# Patient Record
Sex: Female | Born: 1975 | Race: Black or African American | Hispanic: No | Marital: Married | Smoking: Never smoker
Health system: Southern US, Community
[De-identification: ages and names within clinical notes are randomized; demographics above are authoritative.]

## PROBLEM LIST (undated history)

## (undated) DIAGNOSIS — E669 Obesity, unspecified: Secondary | ICD-10-CM

## (undated) DIAGNOSIS — D649 Anemia, unspecified: Secondary | ICD-10-CM

## (undated) DIAGNOSIS — J45909 Unspecified asthma, uncomplicated: Secondary | ICD-10-CM

## (undated) HISTORY — PX: CHOLECYSTECTOMY: SHX55

---

## 2016-04-01 ENCOUNTER — Ambulatory Visit
Admission: EM | Admit: 2016-04-01 | Discharge: 2016-04-01 | Disposition: A | Discharge status: Home / Self Care | Payer: Self-pay | Attending: Family Medicine | Admitting: Family Medicine

## 2016-04-01 DIAGNOSIS — Z862 Personal history of diseases of the blood and blood-forming organs and certain disorders involving the immune mechanism: Secondary | ICD-10-CM

## 2016-04-01 DIAGNOSIS — J0100 Acute maxillary sinusitis, unspecified: Secondary | ICD-10-CM

## 2016-04-01 DIAGNOSIS — R7303 Prediabetes: Secondary | ICD-10-CM

## 2016-04-01 HISTORY — DX: Unspecified asthma, uncomplicated: J45.909

## 2016-04-01 LAB — CBC WITH DIFFERENTIAL/PLATELET
BASOS PCT: 1 %
Basophils Absolute: 0.1 10*3/uL (ref 0–0.1)
EOS ABS: 0.3 10*3/uL (ref 0–0.7)
EOS PCT: 3 %
HEMATOCRIT: 29.9 % — AB (ref 35.0–47.0)
Hemoglobin: 9.3 g/dL — ABNORMAL LOW (ref 12.0–16.0)
Lymphocytes Relative: 20 %
Lymphs Abs: 2.2 10*3/uL (ref 1.0–3.6)
MCH: 20 pg — ABNORMAL LOW (ref 26.0–34.0)
MCHC: 31.1 g/dL — AB (ref 32.0–36.0)
MCV: 64.2 fL — ABNORMAL LOW (ref 80.0–100.0)
MONO ABS: 0.6 10*3/uL (ref 0.2–0.9)
MONOS PCT: 6 %
Neutro Abs: 8 10*3/uL — ABNORMAL HIGH (ref 1.4–6.5)
Neutrophils Relative %: 70 %
PLATELETS: 436 10*3/uL (ref 150–440)
RBC: 4.65 MIL/uL (ref 3.80–5.20)
RDW: 18.3 % — AB (ref 11.5–14.5)
WBC: 11.2 10*3/uL — ABNORMAL HIGH (ref 3.6–11.0)

## 2016-04-01 LAB — GLUCOSE, CAPILLARY: GLUCOSE-CAPILLARY: 80 mg/dL (ref 65–99)

## 2016-04-01 MED ORDER — ALBUTEROL SULFATE HFA 108 (90 BASE) MCG/ACT IN AERS: 1.0000 | INHALATION_SPRAY | Freq: Four times a day (QID) | RESPIRATORY_TRACT | 0 refills | Status: DC | PRN

## 2016-04-01 MED ORDER — AZITHROMYCIN 250 MG PO TABS
ORAL_TABLET | ORAL | 0 refills | Status: DC
Start: 2016-04-01 — End: 2016-11-12

## 2016-04-01 NOTE — ED Triage Notes (Signed)
Patient c/o dizziness, cough (non productive), stuffy nose, headache for about a week.

## 2016-04-01 NOTE — ED Provider Notes (Signed)
[facility]    CSN: [id number] Arrival date & time: 04/01/16  1233  First Provider Contact:  First MD Initiated Contact with Patient 04/01/16 1314        History   Chief Complaint Chief Complaint  Patient presents with  . Dizziness    headache, cough, stuffy nose    HPI Jessica Nguyen is a 40 y.o. female.   The history is provided by the patient.  URI  Presenting symptoms: congestion, cough, fatigue, rhinorrhea and sore throat   Presenting symptoms: no ear pain, no facial pain and no fever   Severity:  Mild ("scatchy") Onset quality:  Gradual Duration:  3 days Timing:  Intermittent Progression:  Waxing and waning Chronicity:  New Relieved by:  Decongestant Worsened by:  Nothing Associated symptoms: wheezing   Dizziness  Quality:  Lightheadedness and vertigo Severity:  Mild Onset quality:  Gradual Timing:  Intermittent Progression:  Waxing and waning Chronicity:  New Context: not with loss of consciousness   Relieved by:  Being still Worsened by:  Movement and turning head Associated symptoms: no blood in stool, no chest pain, no hearing loss, no nausea, no palpitations, no shortness of breath and no tinnitus   Risk factors: anemia     Past Medical History:  Diagnosis Date  . Asthma     There are no active problems to display for this patient.   Past Surgical History:  Procedure Laterality Date  . CESAREAN SECTION    . CHOLECYSTECTOMY      OB History    No data available       Home Medications    Prior to Admission medications   Medication Sig Start Date End Date Taking? Authorizing Provider  Multiple Vitamins-Minerals (MULTIVITAMIN ADULT PO) Take 1 tablet by mouth.   Yes Historical Provider, MD  albuterol (PROVENTIL HFA;VENTOLIN HFA) 108 (90 Base) MCG/ACT inhaler Inhale 1-2 puffs into the lungs every 6 (six) hours as needed for wheezing or shortness of breath. 04/01/16   Duanne Limerick, MD  azithromycin (ZITHROMAX Z-PAK) 250 MG  tablet 2 today then 1 a day for 4 days 04/01/16   Duanne Limerick, MD    Family History History reviewed. No pertinent family history.  Social History Social History  Substance Use Topics  . Smoking status: Never Smoker  . Smokeless tobacco: Never Used  . Alcohol use Yes     Allergies   Penicillins   Review of Systems Review of Systems  Constitutional: Positive for fatigue. Negative for activity change, chills and fever.  HENT: Positive for congestion, rhinorrhea, sinus pressure and sore throat. Negative for ear pain, facial swelling, hearing loss and tinnitus.   Eyes: Negative for discharge.  Respiratory: Positive for cough and wheezing. Negative for chest tightness, shortness of breath and stridor.   Cardiovascular: Negative for chest pain and palpitations.  Gastrointestinal: Negative for abdominal pain, blood in stool and nausea.  Endocrine: Positive for polydipsia and polyuria. Negative for cold intolerance and heat intolerance.  Genitourinary: Positive for menstrual problem.       Heavy periods  Musculoskeletal: Negative.   Neurological: Positive for dizziness.  Hematological: Negative.   Psychiatric/Behavioral: Negative.      Physical Exam Triage Vital Signs ED Triage Vitals  Enc Vitals Group     BP 04/01/16 1250 119/73     Pulse Rate 04/01/16 1250 81     Resp 04/01/16 1250 18     Temp 04/01/16 1250 98 F (36.7 C)  Temp Source 04/01/16 1250 Oral     SpO2 04/01/16 1250 100 %     Weight 04/01/16 1247 300 lb (136.1 kg)     Height 04/01/16 1247 5\' 8"  (1.727 m)     Head Circumference --      Peak Flow --      Pain Score 04/01/16 1250 7     Pain Loc --      Pain Edu? --      Excl. in GC? --    No data found.   Updated Vital Signs BP 119/73 (BP Location: Left Arm)   Pulse 81   Temp 98 F (36.7 C) (Oral)   Resp 18   Ht 5\' 8"  (1.727 m)   Wt 300 lb (136.1 kg)   LMP 02/29/2016   SpO2 100%   BMI 45.61 kg/m   Visual Acuity Right Eye Distance:     Left Eye Distance:   Bilateral Distance:    Right Eye Near:   Left Eye Near:    Bilateral Near:     Physical Exam  Constitutional: She appears well-developed.  HENT:  Head: Normocephalic.  Right Ear: External ear normal.  Left Ear: External ear normal.  Nose: Nose normal.  Mouth/Throat: Oropharynx is clear and moist. No oropharyngeal exudate.  Mucus membranes pale  Eyes: Conjunctivae, EOM and lids are normal. Pupils are equal, round, and reactive to light. Right conjunctiva is not injected. Left conjunctiva is not injected.  Neck: Normal range of motion.  Cardiovascular: Normal rate, regular rhythm, normal heart sounds and intact distal pulses.  Exam reveals no gallop and no friction rub.   No murmur heard. Pulmonary/Chest: Effort normal. No respiratory distress. She has wheezes. She has no rales. She exhibits no tenderness.  Abdominal: Soft. There is no tenderness. There is no rebound and no guarding.  Musculoskeletal: Normal range of motion.  Lymphadenopathy:    She has no cervical adenopathy.  Skin: Skin is warm and dry.     UC Treatments / Results  Labs (all labs ordered are listed, but only abnormal results are displayed) Labs Reviewed  CBC WITH DIFFERENTIAL/PLATELET  CBG MONITORING, ED    EKG  EKG Interpretation None       Radiology No results found.  Procedures Procedures (including critical care time)  Medications Ordered in UC Medications - No data to display   Initial Impression / Assessment and Plan / UC Course  I have reviewed the triage vital signs and the nursing notes.  Pertinent labs & imaging results that were available during my care of the patient were reviewed by me and considered in my medical decision making (see chart for details).  Clinical Course      Final Clinical Impressions(s) / UC Diagnoses   Final diagnoses:  Acute maxillary sinusitis, recurrence not specified  Hx of iron deficiency anemia  Prediabetes    New  Prescriptions New Prescriptions   ALBUTEROL (PROVENTIL HFA;VENTOLIN HFA) 108 (90 BASE) MCG/ACT INHALER    Inhale 1-2 puffs into the lungs every 6 (six) hours as needed for wheezing or shortness of breath.   AZITHROMYCIN (ZITHROMAX Z-PAK) 250 MG TABLET    2 today then 1 a day for 4 days     Duanne Limerickeanna C Jarious Lyon, MD 04/01/16 1537

## 2016-05-18 ENCOUNTER — Encounter: Payer: Self-pay | Admitting: *Deleted

## 2016-05-18 ENCOUNTER — Ambulatory Visit
Admission: EM | Admit: 2016-05-18 | Discharge: 2016-05-18 | Disposition: A | Discharge status: Home / Self Care | Payer: Self-pay | Attending: Family Medicine | Admitting: Family Medicine

## 2016-05-18 DIAGNOSIS — J209 Acute bronchitis, unspecified: Secondary | ICD-10-CM

## 2016-05-18 MED ORDER — HYDROCOD POLST-CPM POLST ER 10-8 MG/5ML PO SUER: 5.0000 mL | Freq: Two times a day (BID) | ORAL | 0 refills | Status: DC | PRN

## 2016-05-18 MED ORDER — FEXOFENADINE-PSEUDOEPHED ER 180-240 MG PO TB24: 1.0000 | ORAL_TABLET | Freq: Every day | ORAL | 0 refills | Status: DC

## 2016-05-18 MED ORDER — ALBUTEROL SULFATE HFA 108 (90 BASE) MCG/ACT IN AERS: 2.0000 | INHALATION_SPRAY | Freq: Four times a day (QID) | RESPIRATORY_TRACT | 0 refills | Status: DC | PRN

## 2016-05-18 MED ORDER — AZITHROMYCIN 250 MG PO TABS: ORAL_TABLET | ORAL | 0 refills | Status: DC

## 2016-05-18 NOTE — ED Provider Notes (Signed)
[facility]    CSN: [id number] Arrival date & time: 05/18/16  1558     History   Chief Complaint Chief Complaint  Patient presents with  . Cough  . Sore Throat  . Headache    HPI Jessica Nguyen is a 40 y.o. female.   Patient symptoms of wheezing and shortness of breath. She states the wheezing and shortness of breath going on for about a week. She reports having some bronchospasm she does have history of asthma she does not smoke. She's had a C-section and cholecystectomy. She is allergic to penicillin. No pertinent family medical history pertaining to today's visit.   The history is provided by the patient. No language interpreter was used.  Cough  Cough characteristics:  Productive, non-productive, croupy, barking, hacking, hoarse, nocturnal and paroxysmal Sputum characteristics:  Yellow Severity:  Moderate Onset quality:  Sudden Duration:  7 days Timing:  Constant Progression:  Worsening Chronicity:  New Smoker: no   Context: upper respiratory infection   Context: not animal exposure, not exposure to allergens, not fumes, not occupational exposure, not sick contacts and not smoke exposure   Relieved by:  Nothing Worsened by:  Nothing Ineffective treatments:  Decongestant and cough suppressants Associated symptoms: headaches, shortness of breath, sore throat and wheezing   Sore Throat  Associated symptoms include headaches and shortness of breath.  Headache  Associated symptoms: cough, sinus pressure and sore throat     Past Medical History:  Diagnosis Date  . Asthma     There are no active problems to display for this patient.   Past Surgical History:  Procedure Laterality Date  . CESAREAN SECTION    . CHOLECYSTECTOMY      OB History    No data available       Home Medications    Prior to Admission medications   Medication Sig Start Date End Date Taking? Authorizing Provider  albuterol (PROVENTIL HFA;VENTOLIN HFA) 108 (90 Base)  MCG/ACT inhaler Inhale 1-2 puffs into the lungs every 6 (six) hours as needed for wheezing or shortness of breath. 04/01/16  Yes Duanne Limerickeanna C Jones, MD  albuterol (PROVENTIL HFA;VENTOLIN HFA) 108 (90 Base) MCG/ACT inhaler Inhale 2 puffs into the lungs every 6 (six) hours as needed for wheezing or shortness of breath. 05/18/16   Hassan RowanEugene Valla Pacey, MD  azithromycin (ZITHROMAX Z-PAK) 250 MG tablet 2 today then 1 a day for 4 days 04/01/16   Duanne Limerickeanna C Jones, MD  azithromycin (ZITHROMAX Z-PAK) 250 MG tablet Take 2 tablets first day and then 1 po a day for 4 days 05/18/16   Hassan RowanEugene Harjit Leider, MD  chlorpheniramine-HYDROcodone [facility](TUSSIONEX PENNKINETIC ER) 10-8 MG/5ML SUER Take 5 mLs by mouth every 12 (twelve) hours as needed for cough. 05/18/16   Hassan RowanEugene Candid Bovey, MD  fexofenadine-pseudoephedrine (ALLEGRA-D ALLERGY & CONGESTION) 180-240 MG 24 hr tablet Take 1 tablet by mouth daily. 05/18/16   Hassan RowanEugene Jt Brabec, MD  Multiple Vitamins-Minerals (MULTIVITAMIN ADULT PO) Take 1 tablet by mouth.    Historical Provider, MD    Family History History reviewed. No pertinent family history.  Social History Social History  Substance Use Topics  . Smoking status: Never Smoker  . Smokeless tobacco: Never Used  . Alcohol use Yes     Allergies   Penicillins   Review of Systems Review of Systems  HENT: Positive for sinus pressure and sore throat.   Respiratory: Positive for cough, shortness of breath and wheezing.   Neurological: Positive for headaches.  All other systems reviewed and  are negative.    Physical Exam Triage Vital Signs ED Triage Vitals  Enc Vitals Group     BP 05/18/16 1728 (!) 111/95     Pulse Rate 05/18/16 1728 78     Resp 05/18/16 1728 16     Temp 05/18/16 1728 98.5 F (36.9 C)     Temp Source 05/18/16 1728 Oral     SpO2 05/18/16 1728 100 %     Weight 05/18/16 1730 300 lb (136.1 kg)     Height 05/18/16 1730 5\' 8"  (1.727 m)     Head Circumference --      Peak Flow --      Pain Score 05/18/16 1735 9     Pain Loc  --      Pain Edu? --      Excl. in GC? --    No data found.   Updated Vital Signs BP (!) 111/95 (BP Location: Left Arm)   Pulse 78   Temp 98.5 F (36.9 C) (Oral)   Resp 16   Ht 5\' 8"  (1.727 m)   Wt 300 lb (136.1 kg)   LMP 05/05/2016   SpO2 100%   BMI 45.61 kg/m   Visual Acuity Right Eye Distance:   Left Eye Distance:   Bilateral Distance:    Right Eye Near:   Left Eye Near:    Bilateral Near:     Physical Exam  Constitutional: She is oriented to person, place, and time. She appears well-developed and well-nourished.  HENT:  Head: Normocephalic and atraumatic.  Right Ear: External ear normal.  Left Ear: External ear normal.  Mouth/Throat: Oropharynx is clear and moist.  Eyes: EOM are normal. Pupils are equal, round, and reactive to light. Right eye exhibits no discharge. Left eye exhibits no discharge.  Neck: Normal range of motion.  Cardiovascular: Normal rate and regular rhythm.   Pulmonary/Chest: She has wheezes.  Musculoskeletal: Normal range of motion. She exhibits no edema or deformity.  Lymphadenopathy:    She has cervical adenopathy.  Neurological: She is alert and oriented to person, place, and time. No cranial nerve deficit.  Skin: Skin is warm.  Psychiatric: She has a normal mood and affect.  Vitals reviewed.    UC Treatments / Results  Labs (all labs ordered are listed, but only abnormal results are displayed) Labs Reviewed - No data to display  EKG  EKG Interpretation None       Radiology No results found.  Procedures Procedures (including critical care time)  Medications Ordered in UC Medications - No data to display   Initial Impression / Assessment and Plan / UC Course  I have reviewed the triage vital signs and the nursing notes.  Pertinent labs & imaging results that were available during my care of the patient were reviewed by me and considered in my medical decision making (see chart for details).  Clinical Course     Patient will be placed on Tussionex 1 teaspoon twice a day Zithromax Z-Pak, albuterol inhaler to use appear basis for wheezing and also place her on Allegra-D 24 hours 1 tablet a day. Follow-up with PCP in one-two weeks if needed. Work note given for today and tomorrow.  Final Clinical Impressions(s) / UC Diagnoses   Final diagnoses:  Acute bronchitis with bronchospasm    New Prescriptions New Prescriptions   ALBUTEROL (PROVENTIL HFA;VENTOLIN HFA) 108 (90 BASE) MCG/ACT INHALER    Inhale 2 puffs into the lungs every 6 (six) hours as needed for wheezing  or shortness of breath.   AZITHROMYCIN (ZITHROMAX Z-PAK) 250 MG TABLET    Take 2 tablets first day and then 1 po a day for 4 days   CHLORPHENIRAMINE-HYDROCODONE (TUSSIONEX PENNKINETIC ER) 10-8 MG/5ML SUER    Take 5 mLs by mouth every 12 (twelve) hours as needed for cough.   FEXOFENADINE-PSEUDOEPHEDRINE (ALLEGRA-D ALLERGY & CONGESTION) 180-240 MG 24 HR TABLET    Take 1 tablet by mouth daily.     Hassan Rowan, MD 05/18/16 [phone]

## 2016-05-18 NOTE — ED Triage Notes (Addendum)
Patient started having symptoms of cough, sore throat, and nasal congestion 6 days ago. Additional symptom of body ache from coughing is also present. Nasal congestion was the first symptom followed by cough and sore throat.

## 2016-09-11 ENCOUNTER — Ambulatory Visit
Admission: EM | Admit: 2016-09-11 | Discharge: 2016-09-11 | Disposition: A | Discharge status: Home / Self Care | Payer: Medicaid Other | Attending: Family Medicine | Admitting: Family Medicine

## 2016-09-11 ENCOUNTER — Encounter: Payer: Self-pay | Admitting: *Deleted

## 2016-09-11 DIAGNOSIS — R69 Illness, unspecified: Secondary | ICD-10-CM | POA: Diagnosis not present

## 2016-09-11 DIAGNOSIS — J111 Influenza due to unidentified influenza virus with other respiratory manifestations: Secondary | ICD-10-CM

## 2016-09-11 DIAGNOSIS — R0981 Nasal congestion: Secondary | ICD-10-CM | POA: Insufficient documentation

## 2016-09-11 DIAGNOSIS — R05 Cough: Secondary | ICD-10-CM | POA: Diagnosis present

## 2016-09-11 DIAGNOSIS — J45909 Unspecified asthma, uncomplicated: Secondary | ICD-10-CM | POA: Diagnosis not present

## 2016-09-11 DIAGNOSIS — J029 Acute pharyngitis, unspecified: Secondary | ICD-10-CM | POA: Insufficient documentation

## 2016-09-11 LAB — RAPID STREP SCREEN (MED CTR MEBANE ONLY): Streptococcus, Group A Screen (Direct): NEGATIVE

## 2016-09-11 MED ORDER — PREDNISONE 20 MG PO TABS: 40.0000 mg | ORAL_TABLET | Freq: Every day | ORAL | 0 refills | Status: AC

## 2016-09-11 MED ORDER — BENZONATATE 100 MG PO CAPS: 100.0000 mg | ORAL_CAPSULE | Freq: Three times a day (TID) | ORAL | 0 refills | Status: DC | PRN

## 2016-09-11 MED ORDER — OSELTAMIVIR PHOSPHATE 75 MG PO CAPS: 75.0000 mg | ORAL_CAPSULE | Freq: Two times a day (BID) | ORAL | 0 refills | Status: DC

## 2016-09-11 NOTE — Discharge Instructions (Signed)
Take medication as prescribed. Rest. Drink plenty of fluids.  ° °Follow up with your primary care physician this week as needed. Return to Urgent care for new or worsening concerns.  ° °

## 2016-09-11 NOTE — ED Triage Notes (Signed)
Patient started having symptoms sore throat, cough, nasal congestion, and body aches 2 days ago.

## 2016-09-11 NOTE — ED Provider Notes (Signed)
[facility] ____________________________________________  Time seen: Approximately 10:40 AM  I have reviewed the triage vital signs and the nursing notes.   HISTORY  Chief Complaint Sore Throat; Cough; and Nasal Congestion   HPI Jessica Nguyen is a 41 y.o. female presenting for complaints of runny nose, nasal congestion, cough, sore throat, chills and body aches that started yesterday morning. Patient reports symptoms unresolved with over-the-counter Mucinex combination medication. Reports has continued to remain active. 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 Denies home sick contacts, except for daughter had sore throat recently none any other symptoms. Reports continues to eat and drink well. Patient reports body aches and chills have continued.  Reports cough intermittently waking her up at night. Reports cough is a dry hacking cough, occasional wheeze. Reports history of asthma that intermittently flares up when she is sick, and reports recurrent wheezing consistent with that. Denies shortness breath or chest pain. Reports has continued to remain active.  Denies chest pain, shortness of breath, abdominal pain, dysuria, extremity pain, extremity swelling or rash. Denies recent sickness. Denies recent antibiotic use. Reports history of asthma. Reports history of borderline diabetes, not on any medications.  Patient's last menstrual period was 07/31/2016. Patient states menstrual is normally irregular. Declines pregnancy.   Past Medical History:  Diagnosis Date  . Asthma     There are no active problems to display for this patient.   Past Surgical History:  Procedure Laterality Date  . CESAREAN SECTION    . CHOLECYSTECTOMY       No current facility-administered medications for this encounter.   Current Outpatient Prescriptions:  .  aspirin EC 81 MG tablet, Take 81 mg by mouth daily., Disp: , Rfl:  .  Multiple  Vitamins-Minerals (MULTIVITAMIN ADULT PO), Take 1 tablet by mouth., Disp: , Rfl:  .  albuterol (PROVENTIL HFA;VENTOLIN HFA) 108 (90 Base) MCG/ACT inhaler, Inhale 1-2 puffs into the lungs every 6 (six) hours as needed for wheezing or shortness of breath., Disp: 1 Inhaler, Rfl: 0 .  albuterol (PROVENTIL HFA;VENTOLIN HFA) 108 (90 Base) MCG/ACT inhaler, Inhale 2 puffs into the lungs every 6 (six) hours as needed for wheezing or shortness of breath., Disp: 1 Inhaler, Rfl: 0 .  azithromycin (ZITHROMAX Z-PAK) 250 MG tablet, 2 today then 1 a day for 4 days, Disp: 6 each, Rfl: 0 .  azithromycin (ZITHROMAX Z-PAK) 250 MG tablet, Take 2 tablets first day and then 1 po a day for 4 days, Disp: 6 tablet, Rfl: 0 .  benzonatate (TESSALON PERLES) 100 MG capsule, Take 1 capsule (100 mg total) by mouth 3 (three) times daily as needed for cough., Disp: 15 capsule, Rfl: 0 .  chlorpheniramine-HYDROcodone (TUSSIONEX PENNKINETIC ER) 10-8 MG/5ML SUER, Take 5 mLs by mouth every 12 (twelve) hours as needed for cough., Disp: 115 mL, Rfl: 0 .  fexofenadine-pseudoephedrine (ALLEGRA-D ALLERGY & CONGESTION) 180-240 MG 24 hr tablet, Take 1 tablet by mouth daily., Disp: 30 tablet, Rfl: 0 .  oseltamivir (TAMIFLU) 75 MG capsule, Take 1 capsule (75 mg total) by mouth every 12 (twelve) hours., Disp: 10 capsule, Rfl: 0 .  predniSONE (DELTASONE) 20 MG tablet, Take 2 tablets (40 mg total) by mouth daily., Disp: 6 tablet, Rfl: 0  Allergies Penicillins  History reviewed. No pertinent family history.  Social History Social History  Substance Use Topics  . Smoking status: Never Smoker  . Smokeless tobacco: Never Used  . Alcohol use Yes    Review of Systems  Constitutional: As above. Eyes: No visual changes. ENT: As above. Cardiovascular: Denies chest pain. Respiratory: Denies shortness of breath. Gastrointestinal: No abdominal pain.  No nausea, no vomiting.  No diarrhea.  No constipation. Genitourinary: Negative for  dysuria. Musculoskeletal: Negative for back pain. Skin: Negative for rash. Neurological: Negative for headaches, focal weakness or numbness.  10-point ROS otherwise negative.  ____________________________________________   PHYSICAL EXAM:  VITAL SIGNS: ED Triage Vitals  Enc Vitals Group     BP 09/11/16 0952 138/77     Pulse Rate 09/11/16 0952 81     Resp 09/11/16 0952 18     Temp 09/11/16 0952 99.2 F (37.3 C)     Temp Source 09/11/16 0952 Oral     SpO2 09/11/16 0952 100 %     Weight 09/11/16 0953 (!) 308 lb (139.7 kg)     Height 09/11/16 0953 5\' 8"  (1.727 m)     Head Circumference --      Peak Flow --      Pain Score 09/11/16 0957 0     Pain Loc --      Pain Edu? --      Excl. in GC? --     Constitutional: Alert and oriented. Well appearing and in no acute distress. Eyes: Conjunctivae are normal. PERRL. EOMI. Head: Atraumatic. No sinus tenderness to palpation. No swelling. No erythema.  Ears: no erythema, normal TMs bilaterally.   Nose:Nasal congestion with clear rhinorrhea  Mouth/Throat: Mucous membranes are moist. Mild pharyngeal erythema. No tonsillar swelling or exudate.  Neck: No stridor.  No cervical spine tenderness to palpation. Hematological/Lymphatic/Immunilogical: No cervical lymphadenopathy. Cardiovascular: Normal rate, regular rhythm. Grossly normal heart sounds.  Good peripheral circulation. Respiratory: Normal respiratory effort.  No retractions. No wheezes, rales or rhonchi. Good air movement. Dry intermittent cough noted in room with mild bronchospasm wheeze. Speaks in complete sentences. Gastrointestinal: Soft and nontender.  Musculoskeletal: Ambulatory with steady gait. No cervical, thoracic or lumbar tenderness to palpation. Bilateral lower extremities nontender to palpation and no edema noted. Neurologic:  Normal speech and language. No gait instability. Skin:  Skin appears warm, dry and intact. No rash noted. Psychiatric: Mood and affect are normal.  Speech and behavior are normal. ___________________________________________   LABS (all labs ordered are listed, but only abnormal results are displayed)  Labs Reviewed  RAPID STREP SCREEN (NOT AT [facility])  CULTURE, GROUP A STREP [facility])    PROCEDURES Procedures    INITIAL IMPRESSION / ASSESSMENT AND PLAN / ED COURSE  Pertinent labs & imaging results that were available during my care of the patient were reviewed by me and considered in my medical decision making (see chart for details).  Well-appearing patient. No acute distress. Quick strep negative, will culture. Suspect influenza-like illness. Discussed with patient evaluation treatment options. Will treat patient with oral Tamiflu, when necessary Tessalon Perles and 3 day course of prednisone. Continue home albuterol as needed. Work note given for today and tomorrow. Encouraged supportive care, rest, fluids.Discussed indication, risks and benefits of medications with patient.  Discussed follow up with Primary care physician this week. Discussed follow up and return parameters including no resolution or any worsening concerns. Patient verbalized understanding and agreed to plan.   ____________________________________________   FINAL CLINICAL IMPRESSION(S) / ED DIAGNOSES  Final diagnoses:  Influenza-like illness     Discharge Medication List as of 09/11/2016 11:00 AM    START taking these medications   Details  benzonatate (TESSALON PERLES) 100 MG capsule Take 1 capsule (100 mg  total) by mouth 3 (three) times daily as needed for cough., Starting Mon 09/11/2016, Normal    oseltamivir (TAMIFLU) 75 MG capsule Take 1 capsule (75 mg total) by mouth every 12 (twelve) hours., Starting Mon 09/11/2016, Normal    predniSONE (DELTASONE) 20 MG tablet Take 2 tablets (40 mg total) by mouth daily., Starting Mon 09/11/2016, Until Thu 09/14/2016, Normal        Note: This dictation was prepared with Dragon dictation along with smaller phrase  technology. Any transcriptional errors that result from this process are unintentional.         Renford DillsLindsey Kenniya Westrich, NP 09/11/16 1142

## 2016-09-14 LAB — CULTURE, GROUP A STREP (THRC)

## 2016-11-12 ENCOUNTER — Ambulatory Visit
Admission: EM | Admit: 2016-11-12 | Discharge: 2016-11-12 | Disposition: A | Discharge status: Home / Self Care | Payer: Medicaid Other | Attending: Emergency Medicine | Admitting: Emergency Medicine

## 2016-11-12 DIAGNOSIS — D649 Anemia, unspecified: Secondary | ICD-10-CM | POA: Insufficient documentation

## 2016-11-12 DIAGNOSIS — J45909 Unspecified asthma, uncomplicated: Secondary | ICD-10-CM | POA: Diagnosis not present

## 2016-11-12 DIAGNOSIS — E669 Obesity, unspecified: Secondary | ICD-10-CM | POA: Insufficient documentation

## 2016-11-12 DIAGNOSIS — R7303 Prediabetes: Secondary | ICD-10-CM | POA: Insufficient documentation

## 2016-11-12 DIAGNOSIS — M79604 Pain in right leg: Secondary | ICD-10-CM | POA: Insufficient documentation

## 2016-11-12 DIAGNOSIS — Z880 Allergy status to penicillin: Secondary | ICD-10-CM | POA: Diagnosis not present

## 2016-11-12 DIAGNOSIS — M7989 Other specified soft tissue disorders: Secondary | ICD-10-CM | POA: Insufficient documentation

## 2016-11-12 DIAGNOSIS — R609 Edema, unspecified: Secondary | ICD-10-CM | POA: Diagnosis not present

## 2016-11-12 DIAGNOSIS — M79605 Pain in left leg: Secondary | ICD-10-CM | POA: Insufficient documentation

## 2016-11-12 HISTORY — DX: Anemia, unspecified: D64.9

## 2016-11-12 HISTORY — DX: Obesity, unspecified: E66.9

## 2016-11-12 LAB — BASIC METABOLIC PANEL
ANION GAP: 7 (ref 5–15)
BUN: 11 mg/dL (ref 6–20)
CHLORIDE: 101 mmol/L (ref 101–111)
CO2: 27 mmol/L (ref 22–32)
Calcium: 8.8 mg/dL — ABNORMAL LOW (ref 8.9–10.3)
Creatinine, Ser: 0.73 mg/dL (ref 0.44–1.00)
GFR calc non Af Amer: >60 mL/min (ref >60)
GLUCOSE: 120 mg/dL — AB (ref 65–99)
Potassium: 3.7 mmol/L (ref 3.5–5.1)
Sodium: 135 mmol/L (ref 135–145)

## 2016-11-12 LAB — URINALYSIS, COMPLETE (UACMP) WITH MICROSCOPIC
Bilirubin Urine: NEGATIVE
GLUCOSE, UA: NEGATIVE mg/dL
KETONES UR: NEGATIVE mg/dL
LEUKOCYTES UA: NEGATIVE
NITRITE: NEGATIVE
PH: 7 (ref 5.0–8.0)
Protein, ur: 30 mg/dL — AB
Specific Gravity, Urine: 1.02 (ref 1.005–1.030)

## 2016-11-12 NOTE — ED Triage Notes (Signed)
Pt reports foot and leg swelling off and on x past month. Usually just left side but now has noticed it on both sides. Burning sensation to both legs. Has spoken to her PCP about same and was told to "keep an eye on it". Also reports a painful "knot" to top of each foot. 7/10. Denies any trouble breathing or SOB

## 2016-11-12 NOTE — ED Provider Notes (Signed)
HPI  SUBJECTIVE:  Jessica Nguyen is a 41 y.o. female who presents with intermittent bilateral lower extremity edema for the past month. She reports bilateral burning pain along her thighs when the swelling is particularly severe. Symptoms are better with elevation, but she states it is not completely resolving her symptoms anymore. Symptoms are worse for standing for prolonged periods of time. She states that she does not add any salt to her diet. No unintentional weight gain, coughing, wheezing, shortness of breath, DOE, orthopnea, nocturia, PND, abdominal pain, hematuria, frothy urine. No lower extremity color changes. No trauma, surgery in the past 4 weeks, hemoptysis. She has a past medical history of obesity, anemia, asthma, prediabetes. No history of hypertension, arrhythmia, atrial fibrillation, CHF, kidney disease, DVT, PE, exogenous estrogen, cancer. LMP: now. Denies possibility pregnant. Status post bilateral tubal ligation. PMD: Dr. Delton See.  Patient saw her PMD on 4/2 but there is no documentation of lower extremity edema    Past Medical History:  Diagnosis Date  . Anemia   . Asthma   . Obese     Past Surgical History:  Procedure Laterality Date  . CESAREAN SECTION    . CHOLECYSTECTOMY      History reviewed. No pertinent family history.  Social History  Substance Use Topics  . Smoking status: Never Smoker  . Smokeless tobacco: Never Used  . Alcohol use Yes     Comment: daily wine or beer    No current facility-administered medications for this encounter.   Current Outpatient Prescriptions:  .  albuterol (PROVENTIL HFA;VENTOLIN HFA) 108 (90 Base) MCG/ACT inhaler, Inhale 2 puffs into the lungs every 6 (six) hours as needed for wheezing or shortness of breath., Disp: 1 Inhaler, Rfl: 0 .  aspirin EC 81 MG tablet, Take 81 mg by mouth daily., Disp: , Rfl:  .  Multiple Vitamins-Minerals (MULTIVITAMIN ADULT PO), Take 1 tablet by mouth., Disp: , Rfl:   Allergies  Allergen  Reactions  . Penicillins Swelling     ROS  As noted in HPI.   Physical Exam  BP (!) 129/52 (BP Location: Left Arm)   Pulse 92   Temp 98 F (36.7 C) (Oral)   Resp 20   Ht  (1.727 m)   Wt (!) 308 lb (139.7 kg)   LMP 11/06/2016   SpO2 100%   BMI 46.83 kg/m   Constitutional: Well developed, well nourished, no acute distress Eyes:  EOMI, conjunctiva normal bilaterally HENT: Normocephalic, atraumatic,mucus membranes moist Respiratory: Normal inspiratory effort Lungs clear bilaterally  Cardiovascular: Normal rate regular rhythm no murmurs rubs or gallops GI: nondistended skin: No rash, skin intact Musculoskeletal: Calves symmetric, nontender. 1+ edema to mid tibial region bilaterally. Bilateral feet normal color, warm, bilateral DP pulses 2+. Sensation light touch and temperature grossly intact.  Neurologic: Alert & oriented x 3, no focal neuro deficits Psychiatric: Speech and behavior appropriate   ED Course   Medications - No data to display  Orders Placed This Encounter  Procedures  . Basic metabolic panel    Standing Status:   Standing    Number of Occurrences:   1  . Urinalysis, Complete w Microscopic    Standing Status:   Standing    Number of Occurrences:   1    Results for orders placed or performed during the hospital encounter of 11/12/16 (from the past 24 hour(s))  Basic metabolic panel     Status: Abnormal   Collection Time: 11/12/16 11:36 AM  Result Value Ref Range  Sodium 135 135 - 145 mmol/L   Potassium 3.7 3.5 - 5.1 mmol/L   Chloride 101 101 - 111 mmol/L   CO2 27 22 - 32 mmol/L   Glucose, Bld 120 (H) 65 - 99 mg/dL   BUN 11 6 - 20 mg/dL   Creatinine, Ser 1.61 0.44 - 1.00 mg/dL   Calcium 8.8 (L) 8.9 - 10.3 mg/dL   GFR calc non Af Amer >60 >60 mL/min   GFR calc Af Amer >60 >60 mL/min   Anion gap 7 5 - 15  Urinalysis, Complete w Microscopic     Status: Abnormal   Collection Time: 11/12/16 11:36 AM  Result Value Ref Range   Color, Urine  YELLOW YELLOW   APPearance CLOUDY (A) CLEAR   Specific Gravity, Urine 1.020 1.005 - 1.030   pH 7.0 5.0 - 8.0   Glucose, UA NEGATIVE NEGATIVE mg/dL   Hgb urine dipstick LARGE (A) NEGATIVE   Bilirubin Urine NEGATIVE NEGATIVE   Ketones, ur NEGATIVE NEGATIVE mg/dL   Protein, ur 30 (A) NEGATIVE mg/dL   Nitrite NEGATIVE NEGATIVE   Leukocytes, UA NEGATIVE NEGATIVE   Squamous Epithelial / LPF 6-30 (A) NONE SEEN   WBC, UA 0-5 0 - 5 WBC/hpf   RBC / HPF 0-5 0 - 5 RBC/hpf   Bacteria, UA FEW (A) NONE SEEN   Amorphous Crystal PRESENT    No results found.  ED Clinical Impression  Peripheral edema  ED Assessment/Plan  Previous records reviewed. As noted in history of present illness.  Pt PERC neg. No evidence of DVT.   Discussed labs, imaging, MDM, plan and followup will check BMP and UA to rule out proteinuria/kidney damage. Otherwise, think this is more positional lower extremity edema. Advised continued elevation, compression stockings. Advised decrease intake of processed foods, especially deli meats. Follow-up with PMD as needed.  Patient has large hematuria but she reports that she is still on her period. Think that protein is from that. BUN/creatinine are normal. No evidence of kidney disease. Plan as above.  Discussed labs, medical decision-making, plan for follow-up with patient. Discussed sn/sx that should prompt return to the ED. Patient agrees with plan.   No orders of the defined types were placed in this encounter.   *This clinic note was created using Dragon dictation software. Therefore, there may be occasional mistakes despite careful proofreading.  ?   Domenick Gong, MD 11/12/16 [phone]

## 2016-11-30 ENCOUNTER — Encounter: Payer: Self-pay | Admitting: Emergency Medicine

## 2016-11-30 ENCOUNTER — Ambulatory Visit
Admission: EM | Admit: 2016-11-30 | Discharge: 2016-11-30 | Disposition: A | Discharge status: Home / Self Care | Payer: Medicaid Other | Attending: Family Medicine | Admitting: Family Medicine

## 2016-11-30 DIAGNOSIS — B9789 Other viral agents as the cause of diseases classified elsewhere: Secondary | ICD-10-CM | POA: Diagnosis not present

## 2016-11-30 DIAGNOSIS — J9801 Acute bronchospasm: Secondary | ICD-10-CM | POA: Diagnosis not present

## 2016-11-30 DIAGNOSIS — J069 Acute upper respiratory infection, unspecified: Secondary | ICD-10-CM

## 2016-11-30 DIAGNOSIS — J209 Acute bronchitis, unspecified: Secondary | ICD-10-CM | POA: Diagnosis not present

## 2016-11-30 MED ORDER — PREDNISONE 10 MG (21) PO TBPK: ORAL_TABLET | ORAL | 0 refills | Status: DC

## 2016-11-30 MED ORDER — HYDROCOD POLST-CPM POLST ER 10-8 MG/5ML PO SUER: 5.0000 mL | Freq: Two times a day (BID) | ORAL | 0 refills | Status: DC | PRN

## 2016-11-30 MED ORDER — FEXOFENADINE-PSEUDOEPHED ER 180-240 MG PO TB24: 1.0000 | ORAL_TABLET | Freq: Every day | ORAL | 0 refills | Status: DC

## 2016-11-30 NOTE — ED Provider Notes (Signed)
[facility]    CSN: [id number] Arrival date & time: 11/30/16  0908     History   Chief Complaint Chief Complaint  Patient presents with  . Cough    HPI Jessica Nguyen is a 41 y.o. female.   Patient is here because of coughing and congestion. She states cough and congestion started Sunday is progressively gotten worse. She reports this medicine at home she was coughing work. She states this was coughing so much that start spraying desensitize her around her greater cough worse and make things worse. Nighttime she's having wheezing episodes. She does have history asthma she has a beta agonist and a long-acting beta agonist that she uses on a regular basis but best I help much for the wheezing either. Cough is nonproductive she's had a lot of nasal congestion as well as coughing. However she does not smoke to stop tobacco. She has history hypertension. Family history hypertension diabetes. Other than C-section gallbladder surgery no other surgeries or operations.   The history is provided by the patient. No language interpreter was used.  Cough  Cough characteristics:  Non-productive Severity:  Moderate Duration:  5 days Timing:  Constant Progression:  Worsening Chronicity:  New Smoker: no   Context: weather changes   Relieved by:  Nothing Worsened by:  Activity Ineffective treatments:  Beta-agonist inhaler Associated symptoms: shortness of breath and wheezing     Past Medical History:  Diagnosis Date  . Anemia   . Asthma   . Obese     There are no active problems to display for this patient.   Past Surgical History:  Procedure Laterality Date  . CESAREAN SECTION    . CHOLECYSTECTOMY      OB History    No data available       Home Medications    Prior to Admission medications   Medication Sig Start Date End Date Taking? Authorizing Provider  budesonide-formoterol (SYMBICORT) 160-4.5 MCG/ACT inhaler Inhale 2 puffs into the lungs 2 (two) times  daily.   Yes [provider]  albuterol (PROVENTIL HFA;VENTOLIN HFA) 108 (90 Base) MCG/ACT inhaler Inhale 2 puffs into the lungs every 6 (six) hours as needed for wheezing or shortness of breath. 05/18/16   Hassan Rowan, MD  aspirin EC 81 MG tablet Take 81 mg by mouth daily.    [provider]  chlorpheniramine-HYDROcodone (TUSSIONEX PENNKINETIC ER) 10-8 MG/5ML SUER Take 5 mLs by mouth every 12 (twelve) hours as needed. 11/30/16   Hassan Rowan, MD  fexofenadine-pseudoephedrine (ALLEGRA-D ALLERGY & CONGESTION) 180-240 MG 24 hr tablet Take 1 tablet by mouth daily. 11/30/16   Hassan Rowan, MD  Multiple Vitamins-Minerals (MULTIVITAMIN ADULT PO) Take 1 tablet by mouth.    [provider]  predniSONE (STERAPRED UNI-PAK 21 TAB) 10 MG (21) TBPK tablet Sig 6 tablet day 1, 5 tablets day 2, 4 tablets day 3,,3tablets day 4, 2 tablets day 5, 1 tablet day 6 take all tablets orally 11/30/16   Hassan Rowan, MD    Family History History reviewed. No pertinent family history.  Social History Social History  Substance Use Topics  . Smoking status: Never Smoker  . Smokeless tobacco: Never Used  . Alcohol use Yes     Comment: daily wine or beer     Allergies   Penicillins   Review of Systems Review of Systems  Respiratory: Positive for cough, shortness of breath and wheezing.   All other systems reviewed and are negative.    Physical Exam  Triage Vital Signs ED Triage Vitals  Enc Vitals Group     BP 11/30/16 0941 (!) 133/93     Pulse Rate 11/30/16 0941 81     Resp 11/30/16 0941 16     Temp 11/30/16 0941 98.1 F (36.7 C)     Temp Source 11/30/16 0941 Oral     SpO2 11/30/16 0941 100 %     Weight 11/30/16 0939 (!) 308 lb (139.7 kg)     Height 11/30/16 0939 5\' 8"  (1.727 m)     Head Circumference --      Peak Flow --      Pain Score 11/30/16 0939 8     Pain Loc --      Pain Edu? --      Excl. in GC? --    No data found.   Updated Vital Signs BP (!) 133/93 (BP  Location: Right Arm)   Pulse 81   Temp 98.1 F (36.7 C) (Oral)   Resp 16   Ht 5\' 8"  (1.727 m)   Wt (!) 308 lb (139.7 kg)   LMP 10/31/2016 (Approximate)   SpO2 100%   BMI 46.83 kg/m   Visual Acuity Right Eye Distance:   Left Eye Distance:   Bilateral Distance:    Right Eye Near:   Left Eye Near:    Bilateral Near:     Physical Exam  Constitutional: She is oriented to person, place, and time. She appears well-developed and well-nourished.  HENT:  Head: Normocephalic and atraumatic.  Right Ear: Hearing, tympanic membrane, external ear and ear canal normal.  Left Ear: Hearing, tympanic membrane, external ear and ear canal normal.  Nose: Mucosal edema present.  Mouth/Throat: Uvula is midline, oropharynx is clear and moist and mucous membranes are normal.  Eyes: Pupils are equal, round, and reactive to light.  Neck: Normal range of motion. Neck supple.  Cardiovascular: Normal rate and regular rhythm.   Pulmonary/Chest: Effort normal and breath sounds normal.  Abdominal: Soft. Bowel sounds are normal.  Musculoskeletal: Normal range of motion.  Neurological: She is alert and oriented to person, place, and time.  Skin: Skin is warm. She is not diaphoretic.  Psychiatric: She has a normal mood and affect.  Vitals reviewed.    UC Treatments / Results  Labs (all labs ordered are listed, but only abnormal results are displayed) Labs Reviewed - No data to display  EKG  EKG Interpretation None       Radiology No results found.  Procedures Procedures (including critical care time)  Medications Ordered in UC Medications - No data to display   Initial Impression / Assessment and Plan / UC Course  I have reviewed the triage vital signs and the nursing notes.  Pertinent labs & imaging results that were available during my care of the patient were reviewed by me and considered in my medical decision making (see chart for details).  Patient has a cough now for about 5  days of antibiotics. We'll place on 60 course of prednisone Tussionex 1 teaspoon twice a day add Allegra-D 1 tablet daily. Will give her work note for today and tomorrow follow-up PCP if needed or follow-up here if not better in a week.    Final Clinical Impressions(s) / UC Diagnoses   Final diagnoses:  Viral URI with cough  Acute bronchitis, unspecified organism  Acute bronchitis with bronchospasm    New Prescriptions New Prescriptions   CHLORPHENIRAMINE-HYDROCODONE (TUSSIONEX PENNKINETIC ER) 10-8 MG/5ML SUER    Take  5 mLs by mouth every 12 (twelve) hours as needed.   FEXOFENADINE-PSEUDOEPHEDRINE (ALLEGRA-D ALLERGY & CONGESTION) 180-240 MG 24 HR TABLET    Take 1 tablet by mouth daily.   PREDNISONE (STERAPRED UNI-PAK 21 TAB) 10 MG (21) TBPK TABLET    Sig 6 tablet day 1, 5 tablets day 2, 4 tablets day 3,,3tablets day 4, 2 tablets day 5, 1 tablet day 6 take all tablets orally     Hassan RowanWade, Taraann Olthoff, MD 11/30/16 1044

## 2016-11-30 NOTE — ED Triage Notes (Signed)
Patient c/o cough and chest congestion for 4 days.  Patient denies fevers.  

## 2017-01-09 ENCOUNTER — Encounter: Payer: Self-pay | Admitting: Gynecology

## 2017-01-09 ENCOUNTER — Ambulatory Visit
Admission: EM | Admit: 2017-01-09 | Discharge: 2017-01-09 | Disposition: A | Discharge status: Home / Self Care | Payer: Medicaid Other | Attending: Emergency Medicine | Admitting: Emergency Medicine

## 2017-01-09 DIAGNOSIS — R197 Diarrhea, unspecified: Secondary | ICD-10-CM

## 2017-01-09 DIAGNOSIS — R112 Nausea with vomiting, unspecified: Secondary | ICD-10-CM

## 2017-01-09 MED ORDER — ONDANSETRON 8 MG PO TBDP
8.0000 mg | ORAL_TABLET | Freq: Once | ORAL | Status: AC
  Administered 2017-01-09: 8 mg via ORAL

## 2017-01-09 MED ORDER — ONDANSETRON 4 MG PO TBDP: 8.0000 mg | ORAL_TABLET | Freq: Three times a day (TID) | ORAL | 0 refills | Status: DC | PRN

## 2017-01-09 NOTE — ED Provider Notes (Signed)
[facility] ____________________________________________  Time seen: Approximately 2:52 PM  I have reviewed the triage vital signs and the nursing notes.   HISTORY  Chief Complaint Abdominal Pain   HPI Jessica Nguyen is a 41 y.o. female  Presents for nausea, vomiting and diarrhea since yesterday afternoon. Patient reports she feels nausea at this time but denies any company abdominal pain at this time. Reports she does have intermittent crampy abdominal discomfort but denies abdominal pain. States she has thrown up approximately 3 times since yesterday afternoon. Reports multiple episodes of diarrhea. Denies any abnormal colored stool or vomit. Denies melena, hematochezia, hemoptysis, abnormal color. Reports has continued to tolerate fluids, primarily sitting on ginger ale and drinking some soups. 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 Patient states is been going around work. Denies pain at this time. Denies attempting fevers. Denies recent sickness. Reports otherwise feels well.  Denies chest pain, shortness of breath, abdominal pain, dysuria, extremity pain, extremity swelling or rash. Denies recent sickness. Denies recent antibiotic use.   Patient's last menstrual period was 12/05/2016. Denies pregnancy. Patient states that her menstrual is normally irregular. Reports prior tubal ligation as well as ablation.   Past Medical History:  Diagnosis Date  . Anemia   . Asthma   . Obese     There are no active problems to display for this patient.   Past Surgical History:  Procedure Laterality Date  . CESAREAN SECTION    . CHOLECYSTECTOMY       No current facility-administered medications for this encounter.   Current Outpatient Prescriptions:  .  albuterol (PROVENTIL HFA;VENTOLIN HFA) 108 (90 Base) MCG/ACT inhaler, Inhale 2 puffs into the lungs every 6 (six) hours as needed for wheezing or shortness of  breath., Disp: 1 Inhaler, Rfl: 0 .  aspirin EC 81 MG tablet, Take 81 mg by mouth daily., Disp: , Rfl:  .  budesonide-formoterol (SYMBICORT) 160-4.5 MCG/ACT inhaler, Inhale 2 puffs into the lungs 2 (two) times daily., Disp: , Rfl:  .  fexofenadine-pseudoephedrine (ALLEGRA-D ALLERGY & CONGESTION) 180-240 MG 24 hr tablet, Take 1 tablet by mouth daily., Disp: 30 tablet, Rfl: 0 .  Multiple Vitamins-Minerals (MULTIVITAMIN ADULT PO), Take 1 tablet by mouth., Disp: , Rfl:  .  chlorpheniramine-HYDROcodone (TUSSIONEX PENNKINETIC ER) 10-8 MG/5ML SUER, Take 5 mLs by mouth every 12 (twelve) hours as needed., Disp: 115 mL, Rfl: 0 .  ondansetron (ZOFRAN ODT) 4 MG disintegrating tablet, Take 2 tablets (8 mg total) by mouth every 8 (eight) hours as needed., Disp: 15 tablet, Rfl: 0 .  predniSONE (STERAPRED UNI-PAK 21 TAB) 10 MG (21) TBPK tablet, Sig 6 tablet day 1, 5 tablets day 2, 4 tablets day 3,,3tablets day 4, 2 tablets day 5, 1 tablet day 6 take all tablets orally, Disp: 21 tablet, Rfl: 0  Allergies Penicillins  No family history on file.  Social History Social History  Substance Use Topics  . Smoking status: Never Smoker  . Smokeless tobacco: Never Used  . Alcohol use Yes     Comment: daily wine or beer    Review of Systems Constitutional: No fever/chills Cardiovascular: Denies chest pain. Respiratory: Denies shortness of breath. Gastrointestinal: As above. Genitourinary: Negative for dysuria. Musculoskeletal: Negative for back pain. Neurological: Negative for headaches, focal weakness or numbness. ____________________________________________   PHYSICAL EXAM:  VITAL SIGNS: ED Triage Vitals  Enc Vitals Group     BP 01/09/17 1440 132/80     Pulse  Rate 01/09/17 1440 80     Resp 01/09/17 1440 16     Temp 01/09/17 1440 98.1 F (36.7 C)     Temp Source 01/09/17 1440 Oral     SpO2 01/09/17 1440 100 %     Weight 01/09/17 1438 (!) 308 lb (139.7 kg)     Height --      Head Circumference --       Peak Flow --      Pain Score 01/09/17 1439 7     Pain Loc --      Pain Edu? --      Excl. in GC? --     Constitutional: Alert and oriented. Well appearing and in no acute distress.      Mouth/Throat: Mucous membranes are moist.Oropharynx non-erythematous. Cardiovascular: Normal rate, regular rhythm. Grossly normal heart sounds.  Good peripheral circulation. Respiratory: Normal respiratory effort without tachypnea nor retractions. Breath sounds are clear and equal bilaterally. No wheezes, rales, rhonchi. Gastrointestinal: Soft and nontender. Obese abdomen. Normal Bowel sounds. No CVA tenderness. Musculoskeletal:   No midline cervical, thoracic or lumbar tenderness to palpation. Neurologic:  Normal speech and language. Speech is normal. No gait instability.  Skin:  Skin is warm, dry Psychiatric: Mood and affect are normal. Speech and behavior are normal. Patient exhibits appropriate insight and judgment   ___________________________________________   LABS (all labs ordered are listed, but only abnormal results are displayed)  Labs Reviewed - No data to display  PROCEDURES Procedures   INITIAL IMPRESSION / ASSESSMENT AND PLAN / ED COURSE  Pertinent labs & imaging results that were available during my care of the patient were reviewed by me and considered in my medical decision making (see chart for details).  Well apperaring patient. No acute distress. Patient with GI symptoms, suspect viral symptoms social with other coworkers with similar. The milligrams ODT Zofran given with an urgent care, tolerating fluids. Encouraged supportive care, when necessary Zofran as needed. Discussed use of over-the-counter Imodium as needed. Encourage BRAT diet. Were note given for today and tomorrow. Discussed indication, risks and benefits of medications with patient.  Discussed follow up with Primary care physician this week. Discussed follow up and return parameters including no resolution or  any worsening concerns. Patient verbalized understanding and agreed to plan.    ____________________________________________   FINAL CLINICAL IMPRESSION(S) / ED DIAGNOSES  Final diagnoses:  Non-intractable vomiting with nausea, unspecified vomiting type  Diarrhea, unspecified type     Discharge Medication List as of 01/09/2017  3:19 PM    START taking these medications   Details  ondansetron (ZOFRAN ODT) 4 MG disintegrating tablet Take 2 tablets (8 mg total) by mouth every 8 (eight) hours as needed., Starting Tue 01/09/2017, Normal        Note: This dictation was prepared with Dragon dictation along with smaller phrase technology. Any transcriptional errors that result from this process are unintentional.         Renford DillsMiller, Raahil Ong, NP 01/09/17 1600

## 2017-01-09 NOTE — ED Triage Notes (Signed)
Per patient c/o stomach issue / diarrhea. 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

## 2017-01-09 NOTE — Discharge Instructions (Signed)
Take medication as prescribed. Rest. Drink plenty of fluids.  ° °Follow up with your primary care physician this week as needed. Return to Urgent care for new or worsening concerns.  ° °

## 2017-03-05 ENCOUNTER — Encounter: Payer: Self-pay | Admitting: Emergency Medicine

## 2017-03-05 ENCOUNTER — Ambulatory Visit
Admission: EM | Admit: 2017-03-05 | Discharge: 2017-03-05 | Disposition: A | Discharge status: Home / Self Care | Payer: Medicaid Other | Attending: Family Medicine | Admitting: Family Medicine

## 2017-03-05 DIAGNOSIS — M5442 Lumbago with sciatica, left side: Secondary | ICD-10-CM

## 2017-03-05 MED ORDER — CYCLOBENZAPRINE HCL 10 MG PO TABS: 10.0000 mg | ORAL_TABLET | Freq: Two times a day (BID) | ORAL | 0 refills | Status: DC | PRN

## 2017-03-05 MED ORDER — MELOXICAM 15 MG PO TABS: 15.0000 mg | ORAL_TABLET | Freq: Every day | ORAL | 0 refills | Status: DC | PRN
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
## 2017-03-05 NOTE — Discharge Instructions (Signed)
Take medication as prescribed. Rest. Drink plenty of fluids. Stretch.  Follow up with your primary care physician tor the above this week as needed. Return to Urgent care for new or worsening concerns.

## 2017-03-05 NOTE — ED Triage Notes (Signed)
Patient c/o back pain for the past 2-3 weeks but got worse this morning.

## 2017-05-02 ENCOUNTER — Other Ambulatory Visit: Payer: Self-pay | Admitting: Medical Oncology

## 2017-05-02 DIAGNOSIS — Z1231 Encounter for screening mammogram for malignant neoplasm of breast: Secondary | ICD-10-CM

## 2017-05-13 ENCOUNTER — Ambulatory Visit
Admission: EM | Admit: 2017-05-13 | Discharge: 2017-05-13 | Disposition: A | Discharge status: Home / Self Care | Payer: Medicaid Other | Attending: Family Medicine | Admitting: Family Medicine

## 2017-05-13 DIAGNOSIS — B349 Viral infection, unspecified: Secondary | ICD-10-CM

## 2017-05-13 DIAGNOSIS — J45909 Unspecified asthma, uncomplicated: Secondary | ICD-10-CM | POA: Insufficient documentation

## 2017-05-13 DIAGNOSIS — D649 Anemia, unspecified: Secondary | ICD-10-CM | POA: Insufficient documentation

## 2017-05-13 DIAGNOSIS — Z880 Allergy status to penicillin: Secondary | ICD-10-CM | POA: Insufficient documentation

## 2017-05-13 DIAGNOSIS — B9789 Other viral agents as the cause of diseases classified elsewhere: Secondary | ICD-10-CM | POA: Insufficient documentation

## 2017-05-13 DIAGNOSIS — Z7982 Long term (current) use of aspirin: Secondary | ICD-10-CM | POA: Insufficient documentation

## 2017-05-13 DIAGNOSIS — E669 Obesity, unspecified: Secondary | ICD-10-CM | POA: Insufficient documentation

## 2017-05-13 DIAGNOSIS — J029 Acute pharyngitis, unspecified: Secondary | ICD-10-CM

## 2017-05-13 DIAGNOSIS — Z79899 Other long term (current) drug therapy: Secondary | ICD-10-CM | POA: Insufficient documentation

## 2017-05-13 LAB — RAPID STREP SCREEN (MED CTR MEBANE ONLY): STREPTOCOCCUS, GROUP A SCREEN (DIRECT): NEGATIVE

## 2017-05-13 NOTE — ED Provider Notes (Signed)
[facility]    CSN: [id number] Arrival date & time: 05/13/17  1050     History   Chief Complaint Chief Complaint  Patient presents with  . Sore Throat    HPI Jessica Nguyen is a 41 y.o. female.   The history is provided by the patient.  Sore Throat  This is a new problem. The current episode started more than 2 days ago. The problem occurs constantly. The problem has been gradually improving. Pertinent negatives include no chest pain, no abdominal pain and no shortness of breath. The symptoms are relieved by acetaminophen and NSAIDs.    Past Medical History:  Diagnosis Date  . Anemia   . Asthma   . Obese     There are no active problems to display for this patient.   Past Surgical History:  Procedure Laterality Date  . CESAREAN SECTION    . CHOLECYSTECTOMY      OB History    No data available       Home Medications    Prior to Admission medications   Medication Sig Start Date End Date Taking? Authorizing Provider  albuterol (PROVENTIL HFA;VENTOLIN HFA) 108 (90 Base) MCG/ACT inhaler Inhale 2 puffs into the lungs every 6 (six) hours as needed for wheezing or shortness of breath. 05/18/16   Hassan Rowan, MD  aspirin EC 81 MG tablet Take 81 mg by mouth daily.    [provider]  cyclobenzaprine (FLEXERIL) 10 MG tablet Take 1 tablet (10 mg total) by mouth 2 (two) times daily as needed for muscle spasms. Do not drive while taking as can cause drowsiness 03/05/17   Renford Dills, NP  meloxicam (MOBIC) 15 MG tablet Take 1 tablet (15 mg total) by mouth daily as needed. 03/05/17   Renford Dills, NP  Multiple Vitamins-Minerals (MULTIVITAMIN ADULT PO) Take 1 tablet by mouth.    [provider]    Family History History reviewed. No pertinent family history.  Social History Social History  Substance Use Topics  . Smoking status: Never Smoker  . Smokeless tobacco: Never Used  . Alcohol use Yes     Comment: daily wine or beer      Allergies   Penicillins   Review of Systems Review of Systems  Respiratory: Negative for shortness of breath.   Cardiovascular: Negative for chest pain.  Gastrointestinal: Negative for abdominal pain.     Physical Exam Triage Vital Signs ED Triage Vitals  Enc Vitals Group     BP 05/13/17 1055 124/67     Pulse Rate 05/13/17 1055 70     Resp 05/13/17 1055 18     Temp 05/13/17 1055 98.1 F (36.7 C)     Temp Source 05/13/17 1055 Oral     SpO2 05/13/17 1055 100 %     Weight 05/13/17 1054 (!) 309 lb (140.2 kg)     Height 05/13/17 1054 5\' 8"  (1.727 m)     Head Circumference --      Peak Flow --      Pain Score 05/13/17 1052 6     Pain Loc --      Pain Edu? --      Excl. in GC? --    No data found.   Updated Vital Signs BP 124/67 (BP Location: Left Arm)   Pulse 70   Temp 98.1 F (36.7 C) (Oral)   Resp 18   Ht 5\' 8"  (1.727 m)   Wt (!) 309 lb (140.2 kg)  LMP 04/16/2017   SpO2 100%   BMI 46.98 kg/m   Visual Acuity Right Eye Distance:   Left Eye Distance:   Bilateral Distance:    Right Eye Near:   Left Eye Near:    Bilateral Near:     Physical Exam  Constitutional: She appears well-developed and well-nourished. No distress.  HENT:  Head: Normocephalic and atraumatic.  Right Ear: Tympanic membrane, external ear and ear canal normal.  Left Ear: Tympanic membrane, external ear and ear canal normal.  Nose: Rhinorrhea present. No mucosal edema, nose lacerations, sinus tenderness, nasal deformity, septal deviation or nasal septal hematoma. No epistaxis.  No foreign bodies. Right sinus exhibits no maxillary sinus tenderness and no frontal sinus tenderness. Left sinus exhibits no maxillary sinus tenderness and no frontal sinus tenderness.  Mouth/Throat: Uvula is midline and mucous membranes are normal. Posterior oropharyngeal erythema present. No oropharyngeal exudate, posterior oropharyngeal edema or tonsillar abscesses. No tonsillar exudate.  Eyes: Pupils are  equal, round, and reactive to light. Conjunctivae and EOM are normal. Right eye exhibits no discharge. Left eye exhibits no discharge. No scleral icterus.  Neck: Normal range of motion. Neck supple. No thyromegaly present.  Cardiovascular: Normal rate, regular rhythm and normal heart sounds.   Pulmonary/Chest: Effort normal and breath sounds normal. No respiratory distress. She has no wheezes. She has no rales.  Lymphadenopathy:    She has no cervical adenopathy.  Skin: She is not diaphoretic.  Nursing note and vitals reviewed.    UC Treatments / Results  Labs (all labs ordered are listed, but only abnormal results are displayed) Labs Reviewed  RAPID STREP SCREEN (NOT [facility])  CULTURE, GROUP A STREP [facility])    EKG  EKG Interpretation None       Radiology No results found.  Procedures Procedures (including critical care time)  Medications Ordered in UC Medications - No data to display   Initial Impression / Assessment and Plan / UC Course  I have reviewed the triage vital signs and the nursing notes.  Pertinent labs & imaging results that were available during my care of the patient were reviewed by me and considered in my medical decision making (see chart for details).       Final Clinical Impressions(s) / UC Diagnoses   Final diagnoses:  Viral pharyngitis    New Prescriptions Discharge Medication List as of 05/13/2017 11:39 AM    1. diagnosis reviewed with patient 2. Recommend supportive treatment with rest, fluids, salt water gargles, otc analgesics 4. Follow-up prn if symptoms worsen or don't improve   Controlled Substance Prescriptions [place] Controlled Substance Registry consulted? Not Applicable   Payton Mccallumonty, Aubrie Lucien, MD 05/13/17 [phone]

## 2017-05-13 NOTE — ED Triage Notes (Signed)
Sore throat pain x Thursday. No fever. Pain 6/10

## 2017-05-16 LAB — CULTURE, GROUP A STREP (THRC)

## 2017-08-29 ENCOUNTER — Encounter: Payer: Self-pay | Admitting: Emergency Medicine

## 2017-08-29 ENCOUNTER — Ambulatory Visit
Admission: EM | Admit: 2017-08-29 | Discharge: 2017-08-29 | Disposition: A | Discharge status: Home / Self Care | Payer: Medicaid Other | Attending: Family Medicine | Admitting: Family Medicine

## 2017-08-29 ENCOUNTER — Other Ambulatory Visit: Payer: Self-pay

## 2017-08-29 DIAGNOSIS — J45901 Unspecified asthma with (acute) exacerbation: Secondary | ICD-10-CM

## 2017-08-29 DIAGNOSIS — R05 Cough: Secondary | ICD-10-CM

## 2017-08-29 DIAGNOSIS — J069 Acute upper respiratory infection, unspecified: Secondary | ICD-10-CM

## 2017-08-29 DIAGNOSIS — B9789 Other viral agents as the cause of diseases classified elsewhere: Secondary | ICD-10-CM

## 2017-08-29 MED ORDER — HYDROCOD POLST-CPM POLST ER 10-8 MG/5ML PO SUER: 5.0000 mL | Freq: Every evening | ORAL | 0 refills | Status: DC | PRN

## 2017-08-29 MED ORDER — AZITHROMYCIN 250 MG PO TABS: ORAL_TABLET | ORAL | 0 refills | Status: DC

## 2017-08-29 MED ORDER — BENZONATATE 100 MG PO CAPS: 100.0000 mg | ORAL_CAPSULE | Freq: Three times a day (TID) | ORAL | 0 refills | Status: DC | PRN

## 2017-08-29 MED ORDER — PREDNISONE 10 MG PO TABS: ORAL_TABLET | ORAL | 0 refills | Status: DC

## 2017-08-29 NOTE — Discharge Instructions (Signed)
Take medication as prescribed. Rest. Drink plenty of fluids.  ° °Follow up with your primary care physician this week as needed. Return to Urgent care for new or worsening concerns.  ° °

## 2017-08-29 NOTE — ED Triage Notes (Signed)
Patient c/o cough, congestion, HAs, and bodyaches and low grade fever since Monday.

## 2017-08-29 NOTE — ED Provider Notes (Signed)
[facility] ____________________________________________  Time seen: Approximately 12:50 PM  I have reviewed the triage vital signs and the nursing notes.   HISTORY  Chief Complaint Headache and Cough   HPI Jessica Nguyen is a 42 y.o. female presented for evaluation of cough and congestion.  Patient reports symptoms have been present for about 3-4 days.  States has had occasional body aches and low-grade fever up to 99.5 last being yesterday.  Denies sore throat.  Reports continues to eat and drink well.  States cough is her biggest complaint.  Does report occasional wheezing, history of asthma.  Has not used her inhaler.  Her kids at home recently sick with similar.  Has not been taken over-the-counter medications for the same complaints.  Denies other aggravating or alleviating factors. Denies chest pain, shortness of breath, abdominal pain,or rash. Denies recent sickness. Denies recent antibiotic use.  Denies recent asthma exacerbation.  Lynett GrimesNelson, Sarah M, PA-C: PCP Patient's last menstrual period was 08/17/2017 (exact date).  Denies pregnancy   Past Medical History:  Diagnosis Date  . Anemia   . Asthma   . Obese     There are no active problems to display for this patient.   Past Surgical History:  Procedure Laterality Date  . CESAREAN SECTION    . CHOLECYSTECTOMY       No current facility-administered medications for this encounter.   Current Outpatient Medications:  .  albuterol (PROVENTIL HFA;VENTOLIN HFA) 108 (90 Base) MCG/ACT inhaler, Inhale 2 puffs into the lungs every 6 (six) hours as needed for wheezing or shortness of breath., Disp: 1 Inhaler, Rfl: 0 .  aspirin EC 81 MG tablet, Take 81 mg by mouth daily., Disp: , Rfl:  .  ferrous sulfate 325 (65 FE) MG tablet, Take 325 mg by mouth daily with breakfast., Disp: , Rfl:  .  Multiple Vitamins-Minerals (MULTIVITAMIN ADULT PO), Take 1 tablet by mouth., Disp: , Rfl:  .  Omega-3 Fatty Acids (FISH OIL) 1000  MG CAPS, Take by mouth., Disp: , Rfl:  .  azithromycin (ZITHROMAX Z-PAK) 250 MG tablet, Take 2 tablets (500 mg) on  Day 1,  followed by 1 tablet (250 mg) once daily on Days 2 through 5., Disp: 6 each, Rfl: 0 .  benzonatate (TESSALON PERLES) 100 MG capsule, Take 1 capsule (100 mg total) by mouth 3 (three) times daily as needed., Disp: 15 capsule, Rfl: 0 .  chlorpheniramine-HYDROcodone (TUSSIONEX PENNKINETIC ER) 10-8 MG/5ML SUER, Take 5 mLs by mouth at bedtime as needed for cough. do not drive or operate machinery while taking as can cause drowsiness., Disp: 75 mL, Rfl: 0 .  predniSONE (DELTASONE) 10 MG tablet, Start 60 mg po day one, then 50 mg po day two, taper by 10 mg daily until complete., Disp: 21 tablet, Rfl: 0  Allergies Penicillins  History reviewed. No pertinent family history.  Social History Social History   Tobacco Use  . Smoking status: Never Smoker  . Smokeless tobacco: Never Used  Substance Use Topics  . Alcohol use: Yes    Comment: daily wine or beer  . Drug use: No    Review of Systems Constitutional:  as above ENT: No sore throat.  As above Cardiovascular: Denies chest pain. Respiratory: Denies shortness of breath. Gastrointestinal: No abdominal pain.  Musculoskeletal: Negative for back pain. Skin: Negative for rash.   ____________________________________________   PHYSICAL EXAM:  VITAL SIGNS: ED Triage Vitals  Enc Vitals Group     BP 08/29/17 1210 (!) 130/94  Pulse Rate 08/29/17 1210 78     Resp 08/29/17 1210 16     Temp 08/29/17 1210 98.1 F (36.7 C)     Temp Source 08/29/17 1210 Oral     SpO2 08/29/17 1210 100 %     Weight 08/29/17 1207 300 lb (136.1 kg)     Height 08/29/17 1207 5\' 8"  (1.727 m)     Head Circumference --      Peak Flow --      Pain Score 08/29/17 1207 7     Pain Loc --      Pain Edu? --      Excl. in GC? --     Constitutional: Alert and oriented. Well appearing and in no acute distress. Eyes: Conjunctivae are normal.    Head: Atraumatic. No sinus tenderness to palpation. No swelling. No erythema.  Ears: no erythema, normal TMs bilaterally.   Nose:Nasal congestion with clear rhinorrhea  Mouth/Throat: Mucous membranes are moist. No pharyngeal erythema. No tonsillar swelling or exudate.  Neck: No stridor.  No cervical spine tenderness to palpation. Hematological/Lymphatic/Immunilogical: No cervical lymphadenopathy. Cardiovascular: Normal rate, regular rhythm. Grossly normal heart sounds.  Good peripheral circulation. Respiratory: Normal respiratory effort.  No retractions. Good air movement.  No wheezes, rales or rhonchi.  Intermittent cough in room with bronchospasm noted.  Speaks in complete sentences. Musculoskeletal: Ambulatory with steady gait. No cervical, thoracic or lumbar tenderness to palpation. Neurologic:  Normal speech and language. No gait instability. Skin:  Skin appears warm, dry and intact. No rash noted. Psychiatric: Mood and affect are normal. Speech and behavior are normal.   ___________________________________________   LABS (all labs ordered are listed, but only abnormal results are displayed)  Labs Reviewed - No data to display   PROCEDURES Procedures    INITIAL IMPRESSION / ASSESSMENT AND PLAN / ED COURSE  Pertinent labs & imaging results that were available during my care of the patient were reviewed by me and considered in my medical decision making (see chart for details).  Well-appearing patient.  No acute distress.  Suspect viral upper respiratory infection with mild asthma exacerbation.  Encouraged rest, fluids, supportive care.  Will treat with prednisone, PRN Tessalon Perles, as needed Tussionex and use albuterol inhaler which she has.  Discussed if symptoms persist past 3 more days without improvement, start oral azithromycin, reevaluation for worsening concerns.  No given for today and tomorrow.Discussed indication, risks and benefits of medications with  patient.  Discussed follow up with Primary care physician this week. Discussed follow up and return parameters including no resolution or any worsening concerns. Patient verbalized understanding and agreed to plan.   ____________________________________________   FINAL CLINICAL IMPRESSION(S) / ED DIAGNOSES  Final diagnoses:  Viral URI with cough  Asthma with acute exacerbation, unspecified asthma severity, unspecified whether persistent     ED Discharge Orders        Ordered    predniSONE (DELTASONE) 10 MG tablet     08/29/17 1231    benzonatate (TESSALON PERLES) 100 MG capsule  3 times daily PRN     08/29/17 1231    chlorpheniramine-HYDROcodone (TUSSIONEX PENNKINETIC ER) 10-8 MG/5ML SUER  At bedtime PRN     08/29/17 1231    azithromycin (ZITHROMAX Z-PAK) 250 MG tablet     08/29/17 1231       Note: This dictation was prepared with Dragon dictation along with smaller phrase technology. Any transcriptional errors that result from this process are unintentional.  Renford Dills, NP 08/29/17 1323

## 2017-09-21 ENCOUNTER — Encounter: Payer: Self-pay | Admitting: *Deleted

## 2017-09-21 ENCOUNTER — Ambulatory Visit
Admission: EM | Admit: 2017-09-21 | Discharge: 2017-09-21 | Disposition: A | Discharge status: Home / Self Care | Payer: Medicaid Other | Attending: Family Medicine | Admitting: Family Medicine

## 2017-09-21 ENCOUNTER — Other Ambulatory Visit: Payer: Self-pay

## 2017-09-21 DIAGNOSIS — R69 Illness, unspecified: Secondary | ICD-10-CM

## 2017-09-21 DIAGNOSIS — R509 Fever, unspecified: Secondary | ICD-10-CM

## 2017-09-21 DIAGNOSIS — J111 Influenza due to unidentified influenza virus with other respiratory manifestations: Secondary | ICD-10-CM

## 2017-09-21 DIAGNOSIS — R51 Headache: Secondary | ICD-10-CM

## 2017-09-21 DIAGNOSIS — J029 Acute pharyngitis, unspecified: Secondary | ICD-10-CM

## 2017-09-21 DIAGNOSIS — M791 Myalgia, unspecified site: Secondary | ICD-10-CM

## 2017-09-21 MED ORDER — OSELTAMIVIR PHOSPHATE 75 MG PO CAPS: 75.0000 mg | ORAL_CAPSULE | Freq: Two times a day (BID) | ORAL | 0 refills | Status: DC

## 2017-09-21 MED ORDER — CETIRIZINE HCL 10 MG PO TABS: 10.0000 mg | ORAL_TABLET | Freq: Every day | ORAL | 0 refills | Status: DC

## 2017-09-21 NOTE — Discharge Instructions (Signed)
-  Tamiflu: one tablet twice daily for 5 days -Zyrtec: one tablet daily -push fluids, rest -follow up with PCP as needed

## 2017-09-21 NOTE — ED Provider Notes (Signed)
[facility]    CSN: [id number] Arrival date & time: 09/21/17  1044     History   Chief Complaint Chief Complaint  Patient presents with  . Generalized Body Aches  . Nasal Congestion    HPI Jessica Nguyen is a 42 y.o. female.   Patient is a 42 year old female who presents with complaint of body aches, stuffy nose, sore throat, headache, and fever.  Per husband, patient's daughter is getting over the flu.  Patient reports achiness and stuffiness started Wednesday.  She states she took a nap Thursday and woke up with her symptoms notably worse.  Patient states she had a temperature of 102 at home.  She reports taking Motrin and Mucinex at home.  She did not get the flu shot.  Patient denies chest pain, shortness of breath, abdominal pain, and nausea.      Past Medical History:  Diagnosis Date  . Anemia   . Asthma   . Obese     There are no active problems to display for this patient.   Past Surgical History:  Procedure Laterality Date  . CESAREAN SECTION    . CHOLECYSTECTOMY      OB History    No data available       Home Medications    Prior to Admission medications   Medication Sig Start Date End Date Taking? Authorizing Provider  albuterol (PROVENTIL HFA;VENTOLIN HFA) 108 (90 Base) MCG/ACT inhaler Inhale 2 puffs into the lungs every 6 (six) hours as needed for wheezing or shortness of breath. 05/18/16  Yes Hassan Rowan, MD  aspirin EC 81 MG tablet Take 81 mg by mouth daily.   Yes [provider]  ferrous sulfate 325 (65 FE) MG tablet Take 325 mg by mouth daily with breakfast.   Yes [provider]  Multiple Vitamins-Minerals (MULTIVITAMIN ADULT PO) Take 1 tablet by mouth.   Yes [provider]  Omega-3 Fatty Acids (FISH OIL) 1000 MG CAPS Take by mouth.   Yes [provider]  cetirizine (ZYRTEC) 10 MG tablet Take 1 tablet (10 mg total) by mouth daily. 09/21/17   Candis Schatz, PA-C  oseltamivir (TAMIFLU) 75 MG  capsule Take 1 capsule (75 mg total) by mouth every 12 (twelve) hours. 09/21/17   Candis Schatz, PA-C    Family History Family History  Problem Relation Age of Onset  . Cancer Mother   . Hypertension Mother     Social History Social History   Tobacco Use  . Smoking status: Never Smoker  . Smokeless tobacco: Never Used  Substance Use Topics  . Alcohol use: Yes    Comment: daily wine or beer  . Drug use: No     Allergies   Penicillins   Review of Systems Review of Systems  As noted above in HPI.  Other systems reviewed and found to be negative.   Physical Exam Triage Vital Signs ED Triage Vitals  Enc Vitals Group     BP 09/21/17 1056 (!) 147/84     Pulse Rate 09/21/17 1056 95     Resp 09/21/17 1056 16     Temp 09/21/17 1056 99.9 F (37.7 C)     Temp Source 09/21/17 1056 Oral     SpO2 09/21/17 1056 99 %     Weight --      Height --      Head Circumference --      Peak Flow --      Pain Score 09/21/17  1057 10     Pain Loc --      Pain Edu? --      Excl. in GC? --    No data found.  Updated Vital Signs BP (!) 147/84 (BP Location: Left Arm)   Pulse 95   Temp 99.9 F (37.7 C) (Oral)   Resp 16   LMP 09/14/2017   SpO2 99%   Visual Acuity Right Eye Distance:   Left Eye Distance:   Bilateral Distance:    Right Eye Near:   Left Eye Near:    Bilateral Near:     Physical Exam  Constitutional: She is oriented to person, place, and time. She appears well-developed and well-nourished.  Laying down on exam table with coat pulled over her head.  HENT:  Head: Normocephalic and atraumatic.  Nose: Right sinus exhibits maxillary sinus tenderness and frontal sinus tenderness. Left sinus exhibits maxillary sinus tenderness and frontal sinus tenderness.  Mouth/Throat: Uvula is midline, oropharynx is clear and moist and mucous membranes are normal. Tonsils are 0 on the right. Tonsils are 0 on the left.  Both tympanic membranes obscured by cerumen.  Eyes: EOM are  normal. Pupils are equal, round, and reactive to light.  Neck: Normal range of motion.  Cardiovascular: Normal rate, regular rhythm and normal heart sounds.  No murmur heard. Pulmonary/Chest: Effort normal and breath sounds normal. No respiratory distress.  Musculoskeletal: Normal range of motion. She exhibits no edema.  Neurological: She is alert and oriented to person, place, and time. No cranial nerve deficit.  Skin: Skin is dry.     UC Treatments / Results  Labs (all labs ordered are listed, but only abnormal results are displayed) Labs Reviewed - No data to display  EKG  EKG Interpretation None       Radiology No results found.  Procedures Procedures (including critical care time)  Medications Ordered in UC Medications - No data to display   Initial Impression / Assessment and Plan / UC Course  I have reviewed the triage vital signs and the nursing notes.  Pertinent labs & imaging results that were available during my care of the patient were reviewed by me and considered in my medical decision making (see chart for details).     Patient with complaint of fever, body aches headache, stuffy nose, sore throat.  Patient does have a flu exposure with her daughter.  Go ahead and treat her with a course of Tamiflu.  Recommend ibuprofen and Tylenol as needed for pain and fever.  Recommend patient push fluids and get rest.  Have her follow-up with her primary care provider as needed.  Final Clinical Impressions(s) / UC Diagnoses   Final diagnoses:  Influenza-like illness    ED Discharge Orders        Ordered    oseltamivir (TAMIFLU) 75 MG capsule  Every 12 hours     09/21/17 1133    cetirizine (ZYRTEC) 10 MG tablet  Daily     09/21/17 1133       Controlled Substance Prescriptions [place] Controlled Substance Registry consulted? Not Applicable   Candis SchatzHarris, Sully Manzi D, PA-C 09/21/17 1133

## 2017-09-21 NOTE — ED Triage Notes (Signed)
PAtient started having symptoms of body aches cough nasal congestion, and sore throat 2 days ago.

## 2018-03-08 ENCOUNTER — Other Ambulatory Visit: Payer: Self-pay

## 2018-03-08 ENCOUNTER — Encounter: Payer: Self-pay | Admitting: Emergency Medicine

## 2018-03-08 ENCOUNTER — Ambulatory Visit
Admission: EM | Admit: 2018-03-08 | Discharge: 2018-03-08 | Disposition: A | Discharge status: Home / Self Care | Payer: Medicaid Other | Attending: Family Medicine | Admitting: Family Medicine

## 2018-03-08 DIAGNOSIS — S39012A Strain of muscle, fascia and tendon of lower back, initial encounter: Secondary | ICD-10-CM

## 2018-03-08 MED ORDER — TIZANIDINE HCL 4 MG PO CAPS: 4.0000 mg | ORAL_CAPSULE | Freq: Three times a day (TID) | ORAL | 0 refills | Status: DC

## 2018-03-08 MED ORDER — NAPROXEN 500 MG PO TABS: 500.0000 mg | ORAL_TABLET | Freq: Two times a day (BID) | ORAL | 0 refills | Status: DC
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
## 2018-03-08 NOTE — Discharge Instructions (Signed)
Apply ice 20 minutes out of every 2 hours 4-5 times daily for comfort. Use  Caution while taking muscle relaxers.  Do not perform activities requiring concentration or judgment and do not drive.  °

## 2018-03-08 NOTE — ED Triage Notes (Signed)
Pt c/o back and leg pain. She worked out 3 days ago and was sore the next day. She knew that was normal but last night she felt like she felt like she had pulled something in her back and her legs. Both legs hurt but more on the left.

## 2018-09-18 ENCOUNTER — Ambulatory Visit
Admission: EM | Admit: 2018-09-18 | Discharge: 2018-09-18 | Disposition: A | Discharge status: Home / Self Care | Payer: Medicaid Other | Attending: Family Medicine | Admitting: Family Medicine

## 2018-09-18 ENCOUNTER — Encounter: Payer: Self-pay | Admitting: Emergency Medicine

## 2018-09-18 ENCOUNTER — Other Ambulatory Visit: Payer: Self-pay

## 2018-09-18 DIAGNOSIS — K529 Noninfective gastroenteritis and colitis, unspecified: Secondary | ICD-10-CM

## 2018-09-18 MED ORDER — ONDANSETRON 4 MG PO TBDP: 4.0000 mg | ORAL_TABLET | Freq: Three times a day (TID) | ORAL | 0 refills | Status: DC | PRN

## 2018-09-18 NOTE — Discharge Instructions (Signed)
Medication as directed.  Lots of fluids.   Rest.  Take care  Dr. Adriana Simas

## 2018-09-18 NOTE — ED Triage Notes (Signed)
Patient in today c/o N/V/D since yesterday morning. Patient denies fever, body aches, headache or cough.

## 2018-09-18 NOTE — ED Provider Notes (Signed)
[facility]    CSN: [id number] Arrival date & time: 09/18/18  0855  History   Chief Complaint Chief Complaint  Patient presents with  . Emesis  . Diarrhea   HPI  43 year old female presents with nausea, vomiting, diarrhea.  Patient reports that her symptoms started yesterday.  She reports nausea, vomiting, diarrhea.  Vomiting has now subsided.  She remains nauseated.  Continues to have diarrhea.  Associated abdominal cramping.  Patient states that she has been tolerating liquids but cannot keep any food down.  No medications have been tried.  Unclear inciting factor. Symptoms are severe.  No other complaints.  PMH, Surgical Hx, Family Hx, Social History reviewed and updated as below.  Past Medical History:  Diagnosis Date  . Anemia   . Asthma   . Obese    Past Surgical History:  Procedure Laterality Date  . CESAREAN SECTION    . CHOLECYSTECTOMY     OB History   No obstetric history on file.    Home Medications    Prior to Admission medications   Medication Sig Start Date End Date Taking? Authorizing Provider  albuterol (PROVENTIL HFA;VENTOLIN HFA) 108 (90 Base) MCG/ACT inhaler Inhale 2 puffs into the lungs every 6 (six) hours as needed for wheezing or shortness of breath. 05/18/16  Yes Hassan Rowan, MD  aspirin EC 81 MG tablet Take 81 mg by mouth daily.   Yes [provider]  ferrous sulfate 325 (65 FE) MG tablet Take 325 mg by mouth daily with breakfast.   Yes [provider]  Multiple Vitamins-Minerals (MULTIVITAMIN ADULT PO) Take 1 tablet by mouth.   Yes [provider]  Omega-3 Fatty Acids (FISH OIL) 1000 MG CAPS Take by mouth.   Yes [provider]  ondansetron (ZOFRAN-ODT) 4 MG disintegrating tablet Take 1 tablet (4 mg total) by mouth every 8 (eight) hours as needed for nausea or vomiting. 09/18/18   Tommie Sams, DO   Family History Family History  Problem Relation Age of Onset  . Cancer Mother   . Hypertension  Mother   . Renal cancer Father   . Brain cancer Father    Social History Social History   Tobacco Use  . Smoking status: Never Smoker  . Smokeless tobacco: Never Used  Substance Use Topics  . Alcohol use: Yes    Comment: occasionally  . Drug use: No   Allergies   Penicillins   Review of Systems Review of Systems  Constitutional: Positive for appetite change.  Gastrointestinal: Positive for diarrhea, nausea and vomiting.   Physical Exam Triage Vital Signs ED Triage Vitals  Enc Vitals Group     BP 09/18/18 0910 (!) 125/94     Pulse Rate 09/18/18 0910 91     Resp 09/18/18 0910 18     Temp 09/18/18 0910 98.1 F (36.7 C)     Temp Source 09/18/18 0910 Oral     SpO2 09/18/18 0910 99 %     Weight 09/18/18 0909 (!) 307 lb (139.3 kg)     Height 09/18/18 0909 5\' 8"  (1.727 m)     Head Circumference --      Peak Flow --      Pain Score 09/18/18 0909 8     Pain Loc --      Pain Edu? --      Excl. in GC? --    Updated Vital Signs BP (!) 125/94 (BP Location: Left Arm)   Pulse 91   Temp  98.1 F (36.7 C) (Oral)   Resp 18   Ht 5\' 8"  (1.727 m)   Wt (!) 139.3 kg   LMP 08/28/2018 (Approximate)   SpO2 99%   BMI 46.68 kg/m   Visual Acuity Right Eye Distance:   Left Eye Distance:   Bilateral Distance:    Right Eye Near:   Left Eye Near:    Bilateral Near:     Physical Exam Vitals signs and nursing note reviewed.  Constitutional:      General: She is not in acute distress.    Appearance: Normal appearance.  HENT:     Head: Normocephalic and atraumatic.     Mouth/Throat:     Mouth: Mucous membranes are moist.     Pharynx: Oropharynx is clear.  Cardiovascular:     Rate and Rhythm: Normal rate and regular rhythm.  Pulmonary:     Effort: Pulmonary effort is normal.     Breath sounds: Normal breath sounds.  Abdominal:     General: There is no distension.     Palpations: Abdomen is soft.     Comments: Mild epigastric tenderness.  Neurological:     Mental Status:  She is alert.  Psychiatric:        Mood and Affect: Mood normal.        Behavior: Behavior normal.    UC Treatments / Results  Labs (all labs ordered are listed, but only abnormal results are displayed) Labs Reviewed - No data to display  EKG None  Radiology No results found.  Procedures Procedures (including critical care time)  Medications Ordered in UC Medications - No data to display  Initial Impression / Assessment and Plan / UC Course  I have reviewed the triage vital signs and the nursing notes.  Pertinent labs & imaging results that were available during my care of the patient were reviewed by me and considered in my medical decision making (see chart for details).    43 year old female presents with gastroenteritis. Treating with zofran. Push fluids. Work note given.  Final Clinical Impressions(s) / UC Diagnoses   Final diagnoses:  Gastroenteritis     Discharge Instructions     Medication as directed.  Lots of fluids.   Rest.  Take care  Dr. Adriana Simas    ED Prescriptions    Medication Sig Dispense Auth. Provider   ondansetron (ZOFRAN-ODT) 4 MG disintegrating tablet Take 1 tablet (4 mg total) by mouth every 8 (eight) hours as needed for nausea or vomiting. 20 tablet Tommie Sams, DO     Controlled Substance Prescriptions [place] Controlled Substance Registry consulted? Not Applicable   Tommie Sams, DO 09/18/18 [phone]

## 2019-03-10 ENCOUNTER — Other Ambulatory Visit: Payer: Self-pay

## 2019-03-10 ENCOUNTER — Ambulatory Visit
Admission: EM | Admit: 2019-03-10 | Discharge: 2019-03-10 | Disposition: A | Discharge status: Home / Self Care | Payer: Medicaid Other | Attending: Family Medicine | Admitting: Family Medicine

## 2019-03-10 DIAGNOSIS — J45909 Unspecified asthma, uncomplicated: Secondary | ICD-10-CM | POA: Diagnosis not present

## 2019-03-10 DIAGNOSIS — Z7982 Long term (current) use of aspirin: Secondary | ICD-10-CM | POA: Insufficient documentation

## 2019-03-10 DIAGNOSIS — E669 Obesity, unspecified: Secondary | ICD-10-CM | POA: Diagnosis not present

## 2019-03-10 DIAGNOSIS — J069 Acute upper respiratory infection, unspecified: Secondary | ICD-10-CM | POA: Insufficient documentation

## 2019-03-10 DIAGNOSIS — Z79899 Other long term (current) drug therapy: Secondary | ICD-10-CM | POA: Insufficient documentation

## 2019-03-10 DIAGNOSIS — Z808 Family history of malignant neoplasm of other organs or systems: Secondary | ICD-10-CM | POA: Diagnosis not present

## 2019-03-10 DIAGNOSIS — Z6841 Body Mass Index (BMI) 40.0 and over, adult: Secondary | ICD-10-CM | POA: Insufficient documentation

## 2019-03-10 DIAGNOSIS — D649 Anemia, unspecified: Secondary | ICD-10-CM | POA: Insufficient documentation

## 2019-03-10 DIAGNOSIS — Z880 Allergy status to penicillin: Secondary | ICD-10-CM | POA: Diagnosis not present

## 2019-03-10 DIAGNOSIS — Z9049 Acquired absence of other specified parts of digestive tract: Secondary | ICD-10-CM | POA: Insufficient documentation

## 2019-03-10 DIAGNOSIS — R05 Cough: Secondary | ICD-10-CM

## 2019-03-10 DIAGNOSIS — Z20828 Contact with and (suspected) exposure to other viral communicable diseases: Secondary | ICD-10-CM | POA: Diagnosis not present

## 2019-03-10 DIAGNOSIS — B9789 Other viral agents as the cause of diseases classified elsewhere: Secondary | ICD-10-CM | POA: Insufficient documentation

## 2019-03-10 DIAGNOSIS — R0981 Nasal congestion: Secondary | ICD-10-CM | POA: Insufficient documentation

## 2019-03-10 DIAGNOSIS — Z809 Family history of malignant neoplasm, unspecified: Secondary | ICD-10-CM | POA: Diagnosis not present

## 2019-03-10 DIAGNOSIS — Z8051 Family history of malignant neoplasm of kidney: Secondary | ICD-10-CM | POA: Diagnosis not present

## 2019-03-10 MED ORDER — ALBUTEROL SULFATE HFA 108 (90 BASE) MCG/ACT IN AERS: 1.0000 | INHALATION_SPRAY | Freq: Four times a day (QID) | RESPIRATORY_TRACT | 0 refills | Status: DC | PRN

## 2019-03-10 NOTE — ED Provider Notes (Signed)
[facility]    CSN: [id number] Arrival date & time: 03/10/19  1537      History   Chief Complaint Chief Complaint  Patient presents with  . Cough    HPI Jessica Nguyen is a 43 y.o. female.   42 yo female with a c/o cough and nasal congestion for the past 5 days. Denies any fevers, chills, chest pain, shortness of breath or wheezing. She does state that she has a h/o asthma and it's been stable but she's out of her albuterol inhaler and is requesting a refill.    Cough   Past Medical History:  Diagnosis Date  . Anemia   . Asthma   . Obese     There are no active problems to display for this patient.   Past Surgical History:  Procedure Laterality Date  . CESAREAN SECTION    . CHOLECYSTECTOMY      OB History   No obstetric history on file.      Home Medications    Prior to Admission medications   Medication Sig Start Date End Date Taking? Authorizing Provider  aspirin EC 81 MG tablet Take 81 mg by mouth daily.   Yes [provider]  ferrous sulfate 325 (65 FE) MG tablet Take 325 mg by mouth daily with breakfast.   Yes [provider]  Multiple Vitamins-Minerals (MULTIVITAMIN ADULT PO) Take 1 tablet by mouth.   Yes [provider]  Omega-3 Fatty Acids (FISH OIL) 1000 MG CAPS Take by mouth.   Yes [provider]  albuterol (VENTOLIN HFA) 108 (90 Base) MCG/ACT inhaler Inhale 1-2 puffs into the lungs every 6 (six) hours as needed for wheezing or shortness of breath. 03/10/19   Norval Gable, MD  ondansetron (ZOFRAN-ODT) 4 MG disintegrating tablet Take 1 tablet (4 mg total) by mouth every 8 (eight) hours as needed for nausea or vomiting. 09/18/18   Coral Spikes, DO    Family History Family History  Problem Relation Age of Onset  . Cancer Mother   . Hypertension Mother   . Renal cancer Father   . Brain cancer Father     Social History Social History   Tobacco Use  . Smoking status: Never Smoker  . Smokeless  tobacco: Never Used  Substance Use Topics  . Alcohol use: Yes    Comment: occasionally  . Drug use: No     Allergies   Penicillins   Review of Systems Review of Systems  Respiratory: Positive for cough.      Physical Exam Triage Vital Signs ED Triage Vitals  Enc Vitals Group     BP 03/10/19 1559 (!) 131/94     Pulse Rate 03/10/19 1559 90     Resp 03/10/19 1559 17     Temp 03/10/19 1559 98.3 F (36.8 C)     Temp Source 03/10/19 1559 Oral     SpO2 03/10/19 1559 100 %     Weight --      Height 03/10/19 1556 5\' 8"  (1.727 m)     Head Circumference --      Peak Flow --      Pain Score 03/10/19 1556 2     Pain Loc --      Pain Edu? --      Excl. in [place]? --    No data found.  Updated Vital Signs BP (!) 131/94 (BP Location: Left Arm)   Pulse 90   Temp 98.3 F (36.8 C) (Oral)  Resp 17   Ht 5\' 8"  (1.727 m)   LMP 02/15/2019   SpO2 100%   BMI 46.68 kg/m   Visual Acuity Right Eye Distance:   Left Eye Distance:   Bilateral Distance:    Right Eye Near:   Left Eye Near:    Bilateral Near:     Physical Exam Vitals signs and nursing note reviewed.  Constitutional:      General: She is not in acute distress.    Appearance: She is not toxic-appearing or diaphoretic.  Cardiovascular:     Rate and Rhythm: Normal rate and regular rhythm.     Pulses: Normal pulses.     Heart sounds: Normal heart sounds.  Pulmonary:     Effort: Pulmonary effort is normal. No respiratory distress.     Breath sounds: Normal breath sounds. No stridor. No wheezing or rhonchi.  Neurological:     Mental Status: She is alert.      UC Treatments / Results  Labs (all labs ordered are listed, but only abnormal results are displayed) Labs Reviewed  NOVEL CORONAVIRUS, NAA (HOSPITAL ORDER, SEND-OUT TO REF LAB)    EKG   Radiology No results found.  Procedures Procedures (including critical care time)  Medications Ordered in UC Medications - No data to display  Initial  Impression / Assessment and Plan / UC Course  I have reviewed the triage vital signs and the nursing notes.  Pertinent labs & imaging results that were available during my care of the patient were reviewed by me and considered in my medical decision making (see chart for details).      Final Clinical Impressions(s) / UC Diagnoses   Final diagnoses:  Viral URI with cough     Discharge Instructions     Rest, fluids, over the counter cold/cough medication Await test result    ED Prescriptions    Medication Sig Dispense Auth. Provider   albuterol (VENTOLIN HFA) 108 (90 Base) MCG/ACT inhaler Inhale 1-2 puffs into the lungs every 6 (six) hours as needed for wheezing or shortness of breath. 8 g Payton Mccallumonty, Darian Ace, MD     1. diagnosis reviewed with patient 2. rx as per orders above; reviewed possible side effects, interactions, risks and benefits; refilled per patient request as she reports being out 3. Recommend supportive treatment as above 4. covid testing done 5. Follow-up prn if symptoms worsen or don't improve  Controlled Substance Prescriptions [place] Controlled Substance Registry consulted? Not Applicable   Payton Mccallumonty, Darrian Goodwill, MD 03/10/19 [phone]

## 2019-03-10 NOTE — ED Triage Notes (Signed)
Patient complains of cough, congestion and sinus pressure. Patient states that her job will not let her come back til she sees a provider.

## 2019-03-10 NOTE — Discharge Instructions (Signed)
Rest, fluids, over the counter cold/cough medication Await test result

## 2019-03-11 LAB — NOVEL CORONAVIRUS, NAA (HOSP ORDER, SEND-OUT TO REF LAB; TAT 18-24 HRS): SARS-CoV-2, NAA: NOT DETECTED

## 2019-04-15 ENCOUNTER — Ambulatory Visit
Admission: EM | Admit: 2019-04-15 | Discharge: 2019-04-15 | Disposition: A | Discharge status: Home / Self Care | Payer: HRSA Program | Attending: Urgent Care | Admitting: Urgent Care

## 2019-04-15 ENCOUNTER — Encounter: Payer: Self-pay | Admitting: Emergency Medicine

## 2019-04-15 ENCOUNTER — Other Ambulatory Visit: Payer: Self-pay

## 2019-04-15 ENCOUNTER — Ambulatory Visit: Payer: HRSA Program

## 2019-04-15 DIAGNOSIS — U071 COVID-19: Secondary | ICD-10-CM | POA: Diagnosis present

## 2019-04-15 DIAGNOSIS — R05 Cough: Secondary | ICD-10-CM | POA: Diagnosis present

## 2019-04-15 DIAGNOSIS — J1289 Other viral pneumonia: Secondary | ICD-10-CM | POA: Diagnosis present

## 2019-04-15 DIAGNOSIS — R059 Cough, unspecified: Secondary | ICD-10-CM

## 2019-04-15 DIAGNOSIS — R110 Nausea: Secondary | ICD-10-CM | POA: Diagnosis present

## 2019-04-15 DIAGNOSIS — J1282 Pneumonia due to coronavirus disease 2019: Secondary | ICD-10-CM

## 2019-04-15 MED ORDER — PREDNISONE 50 MG PO TABS: 50.0000 mg | ORAL_TABLET | Freq: Every day | ORAL | 0 refills | Status: DC

## 2019-04-15 MED ORDER — ONDANSETRON 4 MG PO TBDP: 4.0000 mg | ORAL_TABLET | Freq: Three times a day (TID) | ORAL | 0 refills | Status: DC | PRN

## 2019-04-15 MED ORDER — HYDROCOD POLST-CPM POLST ER 10-8 MG/5ML PO SUER: 5.0000 mL | Freq: Two times a day (BID) | ORAL | 0 refills | Status: DC | PRN

## 2019-04-15 MED ORDER — ONDANSETRON 4 MG PO TBDP
4.0000 mg | ORAL_TABLET | Freq: Once | ORAL | Status: AC
  Administered 2019-04-15: 4 mg via ORAL

## 2019-04-15 NOTE — ED Provider Notes (Signed)
[place], [place]   Name: Jessica Nguyen DOB: 09-21-1975 MRN: [id number] CSN: [id number] PCP: Minna Antis  Arrival date and time:  04/15/19 0916  Chief Complaint:  Cough   NOTE: Prior to seeing the patient today, I have reviewed the triage nursing documentation and vital signs. Clinical staff has updated patient's PMH/PSHx, current medication list, and drug allergies/intolerances to ensure comprehensive history available to assist in medical decision making.   History:   HPI: Jessica Nguyen is a 43 y.o. female who presents today with complaints of worsening cough, pleuritic chest pain, and fever. Fevers have been as high as 101.8 over the last few days. She is doing herself with antipyretics as needed for her fevers. Last APAP dose was at 2300 last night. Patient was tested for SARS-CoV-2 (novel coronavirus) on 04/11/2019, at which time her results were found to be positive. Patient presents today reporting that her cough is worse and is concerned that she is developing pneumonia. Patient denies increased shortness of breath. Her room air oxygen saturations are 96% today upon arrival. Patient reports that she has been fatigued and has been having difficulty sleeping due to her cough. Overall appetite has been decreased. She denies complains of nausea, however she has not vomited. She denies any diarrhea episodes. Patient remains able to eat and drink "some".   Past Medical History:  Diagnosis Date   Anemia    Asthma    Obese     Past Surgical History:  Procedure Laterality Date   CESAREAN SECTION     CHOLECYSTECTOMY      Family History  Problem Relation Age of Onset   Cancer Mother    Hypertension Mother    Renal cancer Father    Brain cancer Father     Social History   Tobacco Use   Smoking status: Never Smoker   Smokeless tobacco: Never Used  Substance Use Topics   Alcohol use: Yes    Comment: occasionally   Drug use: No    There are no active  problems to display for this patient.   Home Medications:    Current Meds  Medication Sig   albuterol (VENTOLIN HFA) 108 (90 Base) MCG/ACT inhaler Inhale 1-2 puffs into the lungs every 6 (six) hours as needed for wheezing or shortness of breath.   aspirin EC 81 MG tablet Take 81 mg by mouth daily.   ferrous sulfate 325 (65 FE) MG tablet Take 325 mg by mouth daily with breakfast.   Multiple Vitamins-Minerals (MULTIVITAMIN ADULT PO) Take 1 tablet by mouth.   Omega-3 Fatty Acids (FISH OIL) 1000 MG CAPS Take by mouth.    Allergies:   Penicillins  Review of Systems (ROS): Review of Systems  Constitutional: Positive for appetite change (decreased), fatigue and fever (Tmax 101.8).  HENT: Negative for congestion, ear pain, postnasal drip, rhinorrhea, sinus pressure, sinus pain, sneezing and sore throat.   Eyes: Negative for pain, discharge and redness.  Respiratory: Positive for cough and chest tightness. Negative for shortness of breath.   Cardiovascular: Positive for chest pain (pleuritic). Negative for palpitations.  Gastrointestinal: Positive for nausea. Negative for abdominal pain, diarrhea and vomiting.  Genitourinary:       Voiding per her baseline habits.   Musculoskeletal: Positive for myalgias. Negative for arthralgias, back pain and neck pain.  Skin: Negative for color change, pallor and rash.  Neurological: Negative for dizziness, syncope, weakness and headaches.  Hematological: Negative for adenopathy.  Psychiatric/Behavioral: Positive for sleep disturbance (2/2 cough).  Vital Signs: Today's Vitals   04/15/19 0932 04/15/19 0936 04/15/19 1055  BP: 112/84    Pulse: (!) 106    Resp: 18    Temp: 99.2 F (37.3 C)    TempSrc: Oral    SpO2: 96% 96%   Weight:  300 lb (136.1 kg)   Height:  5\' 8"  (1.727 m)   PainSc:  9  9     Physical Exam: Physical Exam  Constitutional: She is oriented to person, place, and time and well-developed, well-nourished, and in no  distress. She appears not lethargic and not dehydrated.  Non-toxic appearance. She has a sickly appearance (acutely ill appearing). No distress.  HENT:  Head: Normocephalic and atraumatic.  Nose: Nose normal.  Mouth/Throat: Oropharynx is clear and moist.  Eyes: Pupils are equal, round, and reactive to light. Conjunctivae and EOM are normal.  Neck: Normal range of motion. Neck supple. No tracheal deviation present.  Cardiovascular: Regular rhythm, normal heart sounds and intact distal pulses. Tachycardia present. Exam reveals no gallop and no friction rub.  No murmur heard. Pulmonary/Chest: Effort normal. No accessory muscle usage. No respiratory distress. She has decreased breath sounds in the right lower field and the left lower field. She has wheezes (scattered expiratory). She has no rhonchi. She has no rales.  Deep inspiration elicits cough. Complains of chest wall pain with cough and deep inspiration.   Abdominal: Soft. Normal appearance and bowel sounds are normal. She exhibits no distension. There is no abdominal tenderness.  Musculoskeletal: Normal range of motion.  Lymphadenopathy:    She has no cervical adenopathy.  Neurological: She is alert and oriented to person, place, and time. She appears not lethargic. Gait normal.  Skin: Skin is warm and dry. No rash noted. She is not diaphoretic.  Psychiatric: Mood, memory, affect and judgment normal.  Nursing note and vitals reviewed.   Urgent Care Treatments / Results:   LABS: PLEASE NOTE: all labs that were ordered this encounter are listed, however only abnormal results are displayed. Labs Reviewed - No data to display  EKG: -None  RADIOLOGY: Dg Chest 2 View  Result Date: 04/15/2019 CLINICAL DATA:  Cough.  COVID-19 positive. EXAM: CHEST - 2 VIEW COMPARISON:  None. FINDINGS: Mild bilateral pulmonary opacities are seen in the bases. The heart, hila, mediastinum, lungs, and pleura are otherwise unremarkable. IMPRESSION: Mild  bibasilar pulmonary infiltrates consistent with the history of COVID-19. There is a peripheral predilection to the opacities. Electronically Signed   By: 04/17/2019 III M.D   On: 04/15/2019 10:37    PROCEDURES: Procedures  MEDICATIONS RECEIVED THIS VISIT: Medications  ondansetron (ZOFRAN-ODT) disintegrating tablet 4 mg (4 mg Oral Given 04/15/19 0955)    PERTINENT CLINICAL COURSE NOTES/UPDATES:   Initial Impression / Assessment and Plan / Urgent Care Course:  Pertinent labs & imaging results that were available during my care of the patient were personally reviewed by me and considered in my medical decision making (see lab/imaging section of note for values and interpretations).  Jessica Nguyen is a 43 y.o. female who presents to [facility] today with complaints of worsening cough in the setting of known SARS-CoV-2.   Patient is acutely ill appearing overall in clinic today. She does not appear to be in any acute distress. Presenting symptoms (see HPI) and exam as documented above. Patient tested (+) for SARS-CoV-2 on 04/12/2019. She presents today with worsening cough. CXR reveals mild bibasilar infiltrates related to known SARS-CoV-2. Reviewed that findings are consistent with pneumonia,  however due to SARS-CoV-2 being a viral process, antibiotics will not improve her condition. Discussed supportive care measures at home during acute phase of illness. Patient to rest as much as possible. She was encouraged to ensure adequate hydration (water and ORS) to prevent dehydration and electrolyte derangements. Patient may use APAP and/or IBU on an as needed basis for pain/fever. Will treat with systemic steroids to help with the inflammation and associated chest tightness (wheezing). Will provide a PRN supply of Tussionex to help with cough and pleuritic chest pain. Patient's nausea improved in clinic with a single dose of ondansetron 4 mg. Will send in a PRN supply of ondansetron for her to  use for recurrent nausea. Discussed moving around and frequent pulmonary hygiene in efforts to prevent worsening of her current respiratory symptoms.   Patient has already been taken out of work when she was initially diagnosed with SARS-CoV-2.  Discussed that she will need to continue quarantine at home as already instructed. [facility] been in contact with her regarding follow up. Discussed that she follow the recommendations that she has been provided.   Discussed follow up with primary care physician in 1 week for re-evaluation if not improving, or sooner if acutely worsening. I have reviewed the follow up and strict return precautions for any new or worsening symptoms. She understands that any worsening of her symptoms will need to be evaluated in the emergency department. We discussed that symptoms, if not improving, could potentially require her to be admitted to the hospital for treatment of SARS-CoV-2. At the time of discharge, she verbalized understanding and consent with the discharge plan as it was reviewed with her. All questions were fielded by provider and/or clinic staff prior to patient discharge.    Final Clinical Impressions / Urgent Care Diagnoses:   Final diagnoses:  Pneumonia due to COVID-19 virus  Cough  Nausea without vomiting    New Prescriptions:  [place] Controlled Substance Registry consulted? Yes, I have consulted the [place] Controlled Substances Registry for this patient, and feel the risk/benefit ratio today is favorable for proceeding with this prescription for a controlled substance.   Discussed use of controlled substance medication to treat her acute symptoms.  o Reviewed  STOP Act regulations  o Clinic does not refill controlled substances over the phone without face to face evaluation.   Safety precautions reviewed.  o Medications should not be shared or taken with alcohol.  o Avoid use while working, driving, or operating heavy  machinery.  o Side effects associated with the use of this particular medication reviewed. - Patient understands that this medication can cause CNS depression, increase her risk of falls, and even lead to overdose that may result in death, if used outside of the parameters that she and I discussed.  With all of this in mind, she knowingly accepts the risks and responsibilities associated with intended course of treatment, and elects to responsibly proceed as discussed.  Meds ordered this encounter  Medications   ondansetron (ZOFRAN-ODT) disintegrating tablet 4 mg   predniSONE (DELTASONE) 50 MG tablet    Sig: Take 1 tablet (50 mg total) by mouth daily.    Dispense:  5 tablet    Refill:  0   chlorpheniramine-HYDROcodone (TUSSIONEX PENNKINETIC ER) 10-8 MG/5ML SUER    Sig: Take 5 mLs by mouth every 12 (twelve) hours as needed for cough.    Dispense:  115 mL    Refill:  0   ondansetron (ZOFRAN-ODT) 4  MG disintegrating tablet    Sig: Take 1 tablet (4 mg total) by mouth every 8 (eight) hours as needed for nausea or vomiting.    Dispense:  15 tablet    Refill:  0    Recommended Follow up Care:  Patient encouraged to follow up with the following provider within the specified time frame, or sooner as dictated by the severity of her symptoms. As always, she was instructed that for any urgent/emergent care needs, she should seek care either here or in the emergency department for more immediate evaluation.  Follow-up Information    Clent Jacks, [place] In 1 week.   Specialty: Physician Assistant Contact information: [address] [place] [address] [phone]         NOTE: This note was prepared using Dragon dictation software along with smaller phrase technology. Despite my best ability to proofread, there is the potential that transcriptional errors may still occur from this process, and are completely unintentional.    Verlee Monte, NP 04/15/19 2213

## 2019-04-15 NOTE — ED Triage Notes (Signed)
Patient states she tested positive for COVID on 9/26.  Patient states her Jessica Nguyen is much worse and is afraid she is developing pneumonia

## 2019-04-15 NOTE — Discharge Instructions (Addendum)
It was very nice seeing you today in clinic. Thank you for entrusting me with your care.   Rest and use medications that I have provided. Increase fluid intake as much as possible. Water is always best, as sugar and caffeine containing fluids can cause you to become dehydrated. Try to incorporate electrolyte enriched fluids, such as Gatorade or Pedialyte, into your daily fluid intake.   Make arrangements to follow up with your regular doctor in 1 week for re-evaluation if not improving. If your symptoms/condition worsens, please seek follow up care either here or in the ER. Please remember, our [place] providers are "right here with you" when you need [place].   Again, it was my pleasure to take care of you today. Thank you for choosing our clinic. I hope that you start to feel better quickly.   Honor Loh, MSN, APRN, FNP-C, CEN Advanced Practice Provider [facility]

## 2019-05-12 ENCOUNTER — Ambulatory Visit
Admission: EM | Admit: 2019-05-12 | Discharge: 2019-05-12 | Disposition: A | Discharge status: Home / Self Care | Payer: Self-pay | Attending: Internal Medicine | Admitting: Internal Medicine

## 2019-05-12 ENCOUNTER — Other Ambulatory Visit: Payer: Self-pay

## 2019-05-12 DIAGNOSIS — R05 Cough: Secondary | ICD-10-CM

## 2019-05-12 DIAGNOSIS — R058 Other specified cough: Secondary | ICD-10-CM

## 2019-05-12 MED ORDER — ALBUTEROL SULFATE HFA 108 (90 BASE) MCG/ACT IN AERS: 1.0000 | INHALATION_SPRAY | Freq: Four times a day (QID) | RESPIRATORY_TRACT | 0 refills | Status: DC | PRN

## 2019-05-12 MED ORDER — BENZONATATE 100 MG PO CAPS: 100.0000 mg | ORAL_CAPSULE | Freq: Three times a day (TID) | ORAL | 0 refills | Status: DC

## 2019-05-12 NOTE — ED Provider Notes (Signed)
[facility]    CSN: [id number] Arrival date & time: 05/12/19  1045      History   Chief Complaint Chief Complaint  Patient presents with  . Cough    HPI Jessica Nguyen is a 43 y.o. female with a history of asthma comes to the urgent care setting with complaints of nonproductive cough of several days duration.  Patient was diagnosed with COVID-19 infection on 04/12/2019.  Patient subsequently developed COVID-19 pneumonia which was is currently resolved.  She completed a course of steroids.  Patient has been afebrile.  She experienced improvement in her respiratory symptoms and is currently not short of breath.  Patient has nonproductive cough without wheezing.  No fever or chills.  No chest pain or chest pressure.  Appetite, activity and general wellbeing and is back to baseline.  She is requesting to be evaluated for cough and also requesting for note to return to work.  HPI  Past Medical History:  Diagnosis Date  . Anemia   . Asthma   . Obese     There are no active problems to display for this patient.   Past Surgical History:  Procedure Laterality Date  . CESAREAN SECTION    . CHOLECYSTECTOMY      OB History   No obstetric history on file.      Home Medications    Prior to Admission medications   Medication Sig Start Date End Date Taking? Authorizing Provider  aspirin EC 81 MG tablet Take 81 mg by mouth daily.   Yes [provider]  chlorpheniramine-HYDROcodone (TUSSIONEX PENNKINETIC ER) 10-8 MG/5ML SUER Take 5 mLs by mouth every 12 (twelve) hours as needed for cough. 04/15/19  Yes Karen Kitchens, NP  ferrous sulfate 325 (65 FE) MG tablet Take 325 mg by mouth daily with breakfast.   Yes [provider]  Multiple Vitamins-Minerals (MULTIVITAMIN ADULT PO) Take 1 tablet by mouth.   Yes [provider]  Omega-3 Fatty Acids (FISH OIL) 1000 MG CAPS Take by mouth.   Yes [provider]  albuterol (VENTOLIN HFA) 108 (90  Base) MCG/ACT inhaler Inhale 1-2 puffs into the lungs every 6 (six) hours as needed for wheezing or shortness of breath. 05/12/19   Deshae Dickison, Myrene Galas, MD  benzonatate (TESSALON) 100 MG capsule Take 1 capsule (100 mg total) by mouth every 8 (eight) hours. 05/12/19   LampteyMyrene Galas, MD    Family History Family History  Problem Relation Age of Onset  . Cancer Mother   . Hypertension Mother   . Renal cancer Father   . Brain cancer Father     Social History Social History   Tobacco Use  . Smoking status: Never Smoker  . Smokeless tobacco: Never Used  Substance Use Topics  . Alcohol use: Yes    Comment: occasionally  . Drug use: No     Allergies   Penicillins   Review of Systems Review of Systems  Constitutional: Negative for activity change, chills, fatigue and fever.  HENT: Negative.  Negative for drooling, ear discharge, ear pain, postnasal drip, sinus pressure and sinus pain.   Respiratory: Positive for cough. Negative for chest tightness and wheezing.   Cardiovascular: Negative.   Gastrointestinal: Negative.   Genitourinary: Negative.   Musculoskeletal: Negative.   Skin: Negative.   Neurological: Negative.      Physical Exam Triage Vital Signs ED Triage Vitals [05/12/19 1054]  Enc Vitals Group     BP (!) 141/82  Pulse Rate 84     Resp 16     Temp 98.1 F (36.7 C)     Temp Source Oral     SpO2 97 %     Weight 299 lb 13.2 oz (136 kg)     Height '5\' 8"'  (1.727 m)     Head Circumference      Peak Flow      Pain Score 0     Pain Loc      Pain Edu?      Excl. in [place]?    No data found.  Updated Vital Signs BP (!) 141/82 (BP Location: Left Arm)   Pulse 84   Temp 98.1 F (36.7 C) (Oral)   Resp 16   Ht '5\' 8"'  (1.727 m)   Wt 136 kg   LMP 04/15/2019   SpO2 97%   BMI 45.59 kg/m   Visual Acuity Right Eye Distance:   Left Eye Distance:   Bilateral Distance:    Right Eye Near:   Left Eye Near:    Bilateral Near:     Physical Exam  Constitutional:      General: She is not in acute distress.    Appearance: Normal appearance. She is not ill-appearing.  HENT:     Right Ear: Tympanic membrane normal.     Left Ear: Tympanic membrane normal.     Nose: Nose normal.     Mouth/Throat:     Mouth: Mucous membranes are moist.     Pharynx: No oropharyngeal exudate or posterior oropharyngeal erythema.  Cardiovascular:     Rate and Rhythm: Normal rate and regular rhythm.     Pulses: Normal pulses.     Heart sounds: Normal heart sounds. No murmur. No friction rub.  Pulmonary:     Effort: Pulmonary effort is normal. No respiratory distress.     Breath sounds: Normal breath sounds. No wheezing, rhonchi or rales.  Abdominal:     General: Bowel sounds are normal.     Palpations: Abdomen is soft.  Musculoskeletal: Normal range of motion.        General: No swelling, tenderness or deformity.  Skin:    General: Skin is warm.     Capillary Refill: Capillary refill takes less than 2 seconds.     Coloration: Skin is not jaundiced.     Findings: No bruising or erythema.  Neurological:     General: No focal deficit present.     Mental Status: She is alert and oriented to person, place, and time.      UC Treatments / Results  Labs (all labs ordered are listed, but only abnormal results are displayed) Labs Reviewed - No data to display  EKG   Radiology No results found.  Procedures Procedures (including critical care time)  Medications Ordered in UC Medications - No data to display  Initial Impression / Assessment and Plan / UC Course  I have reviewed the triage vital signs and the nursing notes.  Pertinent labs & imaging results that were available during my care of the patient were reviewed by me and considered in my medical decision making (see chart for details).     1.  Post viral cough: Patient is reassured that cough will improve.  This typically takes 4 to 6 weeks and is probably the last symptom to  resolve.  Tessalon Perles 100 mg every 8 hours as needed for cough was prescribed No indication for further x-rays, steroids or antibiotics. Patient has met  CDC criteria for isolation discontinuation.  Patient can return to work. Final Clinical Impressions(s) / UC Diagnoses   Final diagnoses:  Post-viral cough syndrome   Discharge Instructions   None    ED Prescriptions    Medication Sig Dispense Auth. Provider   albuterol (VENTOLIN HFA) 108 (90 Base) MCG/ACT inhaler Inhale 1-2 puffs into the lungs every 6 (six) hours as needed for wheezing or shortness of breath. 6.7 g Dorman Calderwood, Myrene Galas, MD   benzonatate (TESSALON) 100 MG capsule Take 1 capsule (100 mg total) by mouth every 8 (eight) hours. 21 capsule Masai Kidd, Myrene Galas, MD     PDMP not reviewed this encounter.   Chase Picket, MD 05/12/19 1220

## 2019-05-12 NOTE — ED Triage Notes (Signed)
Pt c/o nonproductive strong cough for greater than 1 week. Pt was dx with COVID 9/26 and then pneumonia 1 week after that. Pt states that she entire course of steroids that were prescribed to her for the pneumonia. Pt reports continued dry nonproductive cough. Pt alert and oriented X4, cooperative, RR even and unlabored, color WNL. Pt in NAD.

## 2019-08-18 ENCOUNTER — Other Ambulatory Visit: Payer: Self-pay | Admitting: Certified Nurse Midwife

## 2019-08-18 DIAGNOSIS — Z1231 Encounter for screening mammogram for malignant neoplasm of breast: Secondary | ICD-10-CM

## 2019-10-21 ENCOUNTER — Other Ambulatory Visit: Payer: Self-pay

## 2019-10-21 ENCOUNTER — Ambulatory Visit
Admission: EM | Admit: 2019-10-21 | Discharge: 2019-10-21 | Disposition: A | Discharge status: Home / Self Care | Payer: BC Managed Care – PPO | Attending: Emergency Medicine | Admitting: Emergency Medicine

## 2019-10-21 DIAGNOSIS — Z9109 Other allergy status, other than to drugs and biological substances: Secondary | ICD-10-CM

## 2019-10-21 DIAGNOSIS — J4521 Mild intermittent asthma with (acute) exacerbation: Secondary | ICD-10-CM

## 2019-10-21 MED ORDER — AEROCHAMBER PLUS MISC: 2 refills | Status: DC

## 2019-10-21 MED ORDER — BENZONATATE 200 MG PO CAPS: 200.0000 mg | ORAL_CAPSULE | Freq: Three times a day (TID) | ORAL | 0 refills | Status: DC | PRN

## 2019-10-21 MED ORDER — FLUTICASONE PROPIONATE 50 MCG/ACT NA SUSP: 2.0000 | Freq: Every day | NASAL | 0 refills | Status: DC

## 2019-10-21 MED ORDER — ALBUTEROL SULFATE HFA 108 (90 BASE) MCG/ACT IN AERS: 1.0000 | INHALATION_SPRAY | Freq: Four times a day (QID) | RESPIRATORY_TRACT | 0 refills | Status: DC | PRN

## 2019-10-21 NOTE — ED Provider Notes (Signed)
HPI  SUBJECTIVE:  Shanley Furlough is a 44 y.o. female who presents with 4 days of dry coughing, wheezing after working in the yard and being outside on a heavy pollen day.  She reports clear nasal congestion sneezing, itchy watery eyes.  States that she is waking up at night coughing.  No fevers body aches headache sore throat loss of sense of smell or taste chest pain shortness of breath nausea vomiting diarrhea abdominal pain.  No sinus pain or pressure postnasal drip.  No GERD symptoms.  No posttussive emesis.  No contacts with Covid.  She has tried her albuterol inhaler twice with significant improvement in her symptoms.  No aggravating factors.  She has a past medical history of asthma.  No intubations recent steroid use or admissions.  She had Covid in September and states that this does not feel similar to that.  She has a history of GERD which resolved years ago.  No formal diagnosis of allergies.  She is not a smoker.  No history of diabetes hypertension.  LMP: 3/1.  Denies possibility being pregnant.  Status post tubal ligation.  ZWC:HENIDPOEU, Lupe Carney   Past Medical History:  Diagnosis Date  . Anemia   . Asthma   . Obese     Past Surgical History:  Procedure Laterality Date  . CESAREAN SECTION    . CHOLECYSTECTOMY      Family History  Problem Relation Age of Onset  . Cancer Mother   . Hypertension Mother   . Renal cancer Father   . Brain cancer Father     Social History   Tobacco Use  . Smoking status: Never Smoker  . Smokeless tobacco: Never Used  Substance Use Topics  . Alcohol use: Yes    Comment: occasionally  . Drug use: No    No current facility-administered medications for this encounter.  Current Outpatient Medications:  .  Ascorbic Acid (VITAMIN C) 500 MG CAPS, Take by mouth., Disp: , Rfl:  .  aspirin EC 81 MG tablet, Take 81 mg by mouth daily., Disp: , Rfl:  .  ferrous sulfate 325 (65 FE) MG tablet, Take 325 mg by mouth daily with breakfast., Disp:  , Rfl:  .  Multiple Vitamins-Minerals (MULTIVITAMIN ADULT PO), Take 1 tablet by mouth., Disp: , Rfl:  .  OVER THE COUNTER MEDICATION, SEA MOSS, Disp: , Rfl:  .  OVER THE COUNTER MEDICATION, APPLE CIDER VINEGAR GUMMIES, Disp: , Rfl:  .  albuterol (VENTOLIN HFA) 108 (90 Base) MCG/ACT inhaler, Inhale 1-2 puffs into the lungs every 6 (six) hours as needed for wheezing or shortness of breath., Disp: 18 g, Rfl: 0 .  benzonatate (TESSALON) 200 MG capsule, Take 1 capsule (200 mg total) by mouth 3 (three) times daily as needed for cough., Disp: 30 capsule, Rfl: 0 .  fluticasone (FLONASE) 50 MCG/ACT nasal spray, Place 2 sprays into both nostrils daily., Disp: 16 g, Rfl: 0 .  Omega-3 Fatty Acids (FISH OIL) 1000 MG CAPS, Take by mouth., Disp: , Rfl:  .  Spacer/Aero-Holding Chambers (AEROCHAMBER PLUS) inhaler, Use as instructed, Disp: 1 each, Rfl: 2  Allergies  Allergen Reactions  . Penicillins Swelling     ROS  As noted in HPI.   Physical Exam  BP (!) 127/103 (BP Location: Left Arm)   Pulse 90   Temp 98.6 F (37 C) (Oral)   Resp 18   Ht 5\' 8"  (1.727 m)   Wt (!) 146.5 kg   LMP 09/20/2019 (Approximate)  SpO2 100%   BMI 49.11 kg/m   Constitutional: Well developed, well nourished, no acute distress Eyes:  EOMI, conjunctiva normal bilaterally HENT: Normocephalic, atraumatic,mucus membranes moist.  Erythematous, but not swollen turbinates.  Positive clear nasal congestion.  No maxillary frontal sinus tenderness.  Normal oropharynx with no postnasal drip, cobblestoning. Respiratory: Normal inspiratory effort, lungs clear bilaterally, good air movement.  No anterior lateral chest wall tenderness Cardiovascular: Normal rate regular rhythm no murmurs rubs or gallops GI: nondistended skin: No rash, skin intact Musculoskeletal: no deformities Neurologic: Alert & oriented x 3, no focal neuro deficits Psychiatric: Speech and behavior appropriate   ED Course   Medications - No data to  display  No orders of the defined types were placed in this encounter.   No results found for this or any previous visit (from the past 24 hour(s)). No results found.  ED Clinical Impression  1. Mild intermittent asthma with exacerbation   2. Environmental allergies      ED Assessment/Plan  Presentation consistent with allergies triggering of mild asthma exacerbation.  Doubt PE CHF pneumonia Covid.  Will send home with an antihistamine/decongestant combination such as Claritin-D Zyrtec-D Allegra-D, Flonase, saline nasal irrigation, Tessalon.  Will refill her albuterol inhaler and prescribed a spacer.  She is to do 2 puffs from her inhaler every 4 hours for 2 days, then every 6 hours for 2 days, then as needed.  Do not think that Covid testing is indicated at this time.  Will write a work note stating that she is cleared to return to work.  Follow-up with PMD if not better, to the ER if she gets worse.   Discussed MDM, treatment plan, and plan for follow-up with patient. Discussed sn/sx that should prompt return to the ED. patient agrees with plan.   Meds ordered this encounter  Medications  . benzonatate (TESSALON) 200 MG capsule    Sig: Take 1 capsule (200 mg total) by mouth 3 (three) times daily as needed for cough.    Dispense:  30 capsule    Refill:  0  . Spacer/Aero-Holding Chambers (AEROCHAMBER PLUS) inhaler    Sig: Use as instructed    Dispense:  1 each    Refill:  2  . albuterol (VENTOLIN HFA) 108 (90 Base) MCG/ACT inhaler    Sig: Inhale 1-2 puffs into the lungs every 6 (six) hours as needed for wheezing or shortness of breath.    Dispense:  18 g    Refill:  0  . fluticasone (FLONASE) 50 MCG/ACT nasal spray    Sig: Place 2 sprays into both nostrils daily.    Dispense:  16 g    Refill:  0    *This clinic note was created using Scientist, clinical (histocompatibility and immunogenetics). Therefore, there may be occasional mistakes despite careful proofreading.   ?    Domenick Gong,  MD 10/21/19 1133

## 2019-10-21 NOTE — Discharge Instructions (Addendum)
Try taking an antihistamine/decongestant combination such as Claritin-D Zyrtec-D Allegra-D, or you can try a plain antihistamine such as Claritin, Zyrtec, or Allegra.  Start Flonase, saline nasal irrigation with a Lloyd Huger med rinse and distilled water as often as you want, Tessalon.   2 puffs from your albuterol inhaler using your spacer every 4 hours for 2 days, then every 6 hours for 2 days, then as needed.

## 2019-10-21 NOTE — ED Triage Notes (Addendum)
Pt presents with c/o dry cough since Saturday. Pt states she was out working in the yard that day. She does report some wheezing and did use her albuterol HFA with some improvement. Pt denies any fever/chills, shob, wheeze (currently), nasal congestion, n/v/d or other symptoms. Pt did have COVID in September 2020 and states this does not feel like that.

## 2020-04-22 ENCOUNTER — Other Ambulatory Visit: Payer: Self-pay

## 2020-04-22 ENCOUNTER — Ambulatory Visit
Admission: EM | Admit: 2020-04-22 | Discharge: 2020-04-22 | Disposition: A | Discharge status: Home / Self Care | Payer: Medicaid Other | Attending: Internal Medicine | Admitting: Internal Medicine

## 2020-04-22 ENCOUNTER — Encounter: Payer: Self-pay | Admitting: Emergency Medicine

## 2020-04-22 DIAGNOSIS — R519 Headache, unspecified: Secondary | ICD-10-CM | POA: Insufficient documentation

## 2020-04-22 DIAGNOSIS — R5383 Other fatigue: Secondary | ICD-10-CM | POA: Diagnosis present

## 2020-04-22 DIAGNOSIS — Z20822 Contact with and (suspected) exposure to covid-19: Secondary | ICD-10-CM

## 2020-04-22 LAB — SARS CORONAVIRUS 2 (TAT 6-24 HRS): SARS Coronavirus 2: NEGATIVE

## 2020-04-22 NOTE — Discharge Instructions (Addendum)

## 2020-04-22 NOTE — ED Triage Notes (Signed)
Pt c/o covid exposure last Saturday. She states she has had a headache since yesterday. Pt states she has some fatigue.

## 2020-04-22 NOTE — ED Provider Notes (Signed)
[facility]    CSN: [id number] Arrival date & time: 04/22/20  1118      History   Chief Complaint Chief Complaint  Patient presents with   Covid Exposure   Headache    HPI Jessica Nguyen is a 44 y.o. female presenting for onset of headaches and fatigue yesterday.  She says she was exposed to Covid last weekend.  Patient does have a history of asthma.  She has not been vaccinated for Covid.  Patient has a personal history of COVID-19  about a year ago according to patient.  She denies any fever, cough, congestion, sore throat, chest pain, pain difficulty, abdominal pain, nausea, vomiting, diarrhea, or change in smell or taste.  Has been taking over-the-counter Motrin and Tylenol for relief of the headache.  Patient has no other complaints or concerns today.  HPI  Past Medical History:  Diagnosis Date   Anemia    Asthma    Obese     There are no problems to display for this patient.   Past Surgical History:  Procedure Laterality Date   CESAREAN SECTION     CHOLECYSTECTOMY      OB History   No obstetric history on file.      Home Medications    Prior to Admission medications   Medication Sig Start Date End Date Taking? Authorizing Provider  albuterol (VENTOLIN HFA) 108 (90 Base) MCG/ACT inhaler Inhale 1-2 puffs into the lungs every 6 (six) hours as needed for wheezing or shortness of breath. 10/21/19  Yes Domenick Gong, MD  aspirin EC 81 MG tablet Take 81 mg by mouth daily.   Yes [provider]  OVER THE COUNTER MEDICATION SEA MOSS   Yes [provider]  OVER THE COUNTER MEDICATION APPLE CIDER VINEGAR GUMMIES   Yes [provider]  Ascorbic Acid (VITAMIN C) 500 MG CAPS Take by mouth.    [provider]  benzonatate (TESSALON) 200 MG capsule Take 1 capsule (200 mg total) by mouth 3 (three) times daily as needed for cough. 10/21/19   Domenick Gong, MD  ferrous sulfate 325 (65 FE) MG tablet Take 325 mg by  mouth daily with breakfast.    [provider]  fluticasone (FLONASE) 50 MCG/ACT nasal spray Place 2 sprays into both nostrils daily. 10/21/19   Domenick Gong, MD  Multiple Vitamins-Minerals (MULTIVITAMIN ADULT PO) Take 1 tablet by mouth.    [provider]  Omega-3 Fatty Acids (FISH OIL) 1000 MG CAPS Take by mouth.    [provider]  Spacer/Aero-Holding Chambers (AEROCHAMBER PLUS) inhaler Use as instructed 10/21/19   Domenick Gong, MD    Family History Family History  Problem Relation Age of Onset   Cancer Mother    Hypertension Mother    Renal cancer Father    Brain cancer Father     Social History Social History   Tobacco Use   Smoking status: Never Smoker   Smokeless tobacco: Never Used  Building services engineer Use: Never used  Substance Use Topics   Alcohol use: Yes    Comment: occasionally   Drug use: No     Allergies   Penicillins   Review of Systems Review of Systems  Constitutional: Positive for fatigue. Negative for chills, diaphoresis and fever.  HENT: Negative for congestion, ear pain, rhinorrhea, sinus pressure, sinus pain and sore throat.   Respiratory: Negative for cough and shortness of breath.   Gastrointestinal: Negative for abdominal pain, nausea and vomiting.  Musculoskeletal: Negative for arthralgias and myalgias.  Skin: Negative for rash.  Neurological: Positive for headaches. Negative for weakness.  Hematological: Negative for adenopathy.     Physical Exam Triage Vital Signs ED Triage Vitals  Enc Vitals Group     BP 04/22/20 1155 133/82     Pulse Rate 04/22/20 1155 100     Resp --      Temp 04/22/20 1155 98.2 F (36.8 C)     Temp Source 04/22/20 1155 Oral     SpO2 04/22/20 1155 100 %     Weight --      Height --      Head Circumference --      Peak Flow --      Pain Score 04/22/20 1151 7     Pain Loc --      Pain Edu? --      Excl. in GC? --    No data found.  Updated Vital Signs BP  133/82 (BP Location: Left Arm)    Pulse 100    Temp 98.2 F (36.8 C) (Oral)    LMP 03/31/2020    SpO2 100%       Physical Exam Vitals and nursing note reviewed.  Constitutional:      General: She is not in acute distress.    Appearance: Normal appearance. She is obese. She is not ill-appearing or toxic-appearing.  HENT:     Head: Normocephalic and atraumatic.     Nose: Nose normal.     Mouth/Throat:     Mouth: Mucous membranes are moist.     Pharynx: Oropharynx is clear.  Eyes:     General: No scleral icterus.       Right eye: No discharge.        Left eye: No discharge.     Conjunctiva/sclera: Conjunctivae normal.  Cardiovascular:     Rate and Rhythm: Normal rate and regular rhythm.     Heart sounds: Normal heart sounds.  Pulmonary:     Effort: Pulmonary effort is normal. No respiratory distress.     Breath sounds: Normal breath sounds.  Musculoskeletal:     Cervical back: Neck supple.  Skin:    General: Skin is dry.  Neurological:     General: No focal deficit present.     Mental Status: She is alert. Mental status is at baseline.     Motor: No weakness.     Gait: Gait normal.  Psychiatric:        Mood and Affect: Mood normal.        Behavior: Behavior normal.        Thought Content: Thought content normal.      UC Treatments / Results  Labs (all labs ordered are listed, but only abnormal results are displayed) Labs Reviewed  SARS CORONAVIRUS 2 (TAT 6-24 HRS)    EKG   Radiology No results found.  Procedures Procedures (including critical care time)  Medications Ordered in UC Medications - No data to display  Initial Impression / Assessment and Plan / UC Course  I have reviewed the triage vital signs and the nursing notes.  Pertinent labs & imaging results that were available during my care of the patient were reviewed by me and considered in my medical decision making (see chart for details).   Exam within normal limits.  All vital signs normal  and stable.  Covid testing obtained.  CDC guidelines, isolation protocol and ED precautions discussed.  Advised patient to continue at  home care.  If positive for Covid she would be interested in IV therapy and I advised her that someone will contact her for that.  In the meantime, if any symptoms acutely worsen she should be seen again.  Patient agreeable.  Final Clinical Impressions(s) / UC Diagnoses   Final diagnoses:  Exposure to COVID-19 virus  Acute nonintractable headache, unspecified headache type  Fatigue, unspecified type     Discharge Instructions     You have received COVID testing today either for positive exposure, concerning symptoms that could be related to COVID infection, screening purposes, or re-testing after confirmed positive.  Your test obtained today checks for active viral infection in the last 1-2 weeks. If your test is negative now, you can still test positive later. So, if you do develop symptoms you should either get re-tested and/or isolate x 10 days. Please follow CDC guidelines.  While Rapid antigen tests come back in 15-20 minutes, send out PCR/molecular test results typically come back within 24 hours. In the mean time, if you are symptomatic, assume this could be a positive test and treat/monitor yourself as if you do have COVID.   We will call with test results. Please download the MyChart app and set up a profile to access test results.   If symptomatic, go home and rest. Push fluids. Take Tylenol as needed for discomfort. Gargle warm salt water. Throat lozenges. Take Mucinex DM or Robitussin for cough. Humidifier in bedroom to ease coughing. Warm showers. Also review the COVID handout for more information.  COVID-19 INFECTION: The incubation period of COVID-19 is approximately 14 days after exposure, with most symptoms developing in roughly 4-5 days. Symptoms may range in severity from mild to critically severe. Roughly 80% of those infected will have mild  symptoms. People of any age may become infected with COVID-19 and have the ability to transmit the virus. The most common symptoms include: fever, fatigue, cough, body aches, headaches, sore throat, nasal congestion, shortness of breath, nausea, vomiting, diarrhea, changes in smell and/or taste.    COURSE OF ILLNESS Some patients may begin with mild disease which can progress quickly into critical symptoms. If your symptoms are worsening please call ahead to the Emergency Department and proceed there for further treatment. Recovery time appears to be roughly 1-2 weeks for mild symptoms and 3-6 weeks for severe disease.   GO IMMEDIATELY TO ER FOR FEVER YOU ARE UNABLE TO GET DOWN WITH TYLENOL, BREATHING PROBLEMS, CHEST PAIN, FATIGUE, LETHARGY, INABILITY TO EAT OR DRINK, ETC  QUARANTINE AND ISOLATION: To help decrease the spread of COVID-19 please remain isolated if you have COVID infection or are highly suspected to have COVID infection. This means -stay home and isolate to one room in the home if you live with others. Do not share a bed or bathroom with others while ill, sanitize and wipe down all countertops and keep common areas clean and disinfected. You may discontinue isolation if you have a mild case and are asymptomatic 10 days after symptom onset as long as you have been fever free >24 hours without having to take Motrin or Tylenol. If your case is more severe (meaning you develop pneumonia or are admitted in the hospital), you may have to isolate longer.   If you have been in close contact (within 6 feet) of someone diagnosed with COVID 19, you are advised to quarantine in your home for 14 days as symptoms can develop anywhere from 2-14 days after exposure to the virus. If  you develop symptoms, you  must isolate.  Most current guidelines for COVID after exposure -isolate 10 days if you ARE NOT tested for COVID as long as symptoms do not develop -isolate 7 days if you are tested and remain  asymptomatic -You do not necessarily need to be tested for COVID if you have + exposure and        develop   symptoms. Just isolate at home x10 days from symptom onset During this global pandemic, CDC advises to practice social distancing, try to stay at least 78ft away from others at all times. Wear a face covering. Wash and sanitize your hands regularly and avoid going anywhere that is not necessary.  KEEP IN MIND THAT THE COVID TEST IS NOT 100% ACCURATE AND YOU SHOULD STILL DO EVERYTHING TO PREVENT POTENTIAL SPREAD OF VIRUS TO OTHERS (WEAR MASK, WEAR GLOVES, WASH HANDS AND SANITIZE REGULARLY). IF INITIAL TEST IS NEGATIVE, THIS MAY NOT MEAN YOU ARE DEFINITELY NEGATIVE. MOST ACCURATE TESTING IS DONE 5-7 DAYS AFTER EXPOSURE.   It is not advised by CDC to get re-tested after receiving a positive COVID test since you can still test positive for weeks to months after you have already cleared the virus.   *If you have not been vaccinated for COVID, I strongly suggest you consider getting vaccinated as long as there are no contraindications.      ED Prescriptions    None     PDMP not reviewed this encounter.   Shirlee Latch, PA-C 04/22/20 1231

## 2020-08-26 ENCOUNTER — Ambulatory Visit (INDEPENDENT_AMBULATORY_CARE_PROVIDER_SITE_OTHER): Payer: Self-pay

## 2020-08-26 ENCOUNTER — Other Ambulatory Visit: Payer: Self-pay

## 2020-08-26 ENCOUNTER — Ambulatory Visit
Admission: EM | Admit: 2020-08-26 | Discharge: 2020-08-26 | Disposition: A | Discharge status: Home / Self Care | Payer: Self-pay | Attending: Sports Medicine | Admitting: Sports Medicine

## 2020-08-26 DIAGNOSIS — M25511 Pain in right shoulder: Secondary | ICD-10-CM

## 2020-08-26 DIAGNOSIS — M25611 Stiffness of right shoulder, not elsewhere classified: Secondary | ICD-10-CM

## 2020-08-26 MED ORDER — DICLOFENAC SODIUM 75 MG PO TBEC: 75.0000 mg | DELAYED_RELEASE_TABLET | Freq: Two times a day (BID) | ORAL | 0 refills | Status: DC

## 2020-08-26 NOTE — ED Provider Notes (Addendum)
[facility]    CSN: [id number] Arrival date & time: 08/26/20  5697      History   Chief Complaint Chief Complaint  Patient presents with  . Shoulder Pain    right    HPI Jessica Nguyen is a 45 y.o. female.   Pleasant 45 year old right-hand-dominant female who presents for evaluation of the above issue.  She said that she woke up Monday, February 7 with acute pain in her right shoulder.  She was unable to lift her right arm.  No accidents trauma or falls.  No changes in activity.  She says the pain is worse with movement and can shoot down her arm.  She denies neck pain numbness or tingling.  No chronic issues with this right shoulder.  She has been using ibuprofen, heat, and IcyHot with limited success.  She works as a Conservation officer, nature at [employer] and has worked a few shifts with this but felt she could not go in today so comes in today for initial evaluation.  She also denies any recent vaccines or any treatment of that shoulder area.  No red flag signs or symptoms elicited on history.     Past Medical History:  Diagnosis Date  . Anemia   . Asthma   . Obese     There are no problems to display for this patient.   Past Surgical History:  Procedure Laterality Date  . CESAREAN SECTION    . CHOLECYSTECTOMY      OB History   No obstetric history on file.      Home Medications    Prior to Admission medications   Medication Sig Start Date End Date Taking? Authorizing Provider  albuterol (VENTOLIN HFA) 108 (90 Base) MCG/ACT inhaler Inhale 1-2 puffs into the lungs every 6 (six) hours as needed for wheezing or shortness of breath. 10/21/19  Yes Domenick Gong, MD  Ascorbic Acid (VITAMIN C) 500 MG CAPS Take by mouth.   Yes [provider]  aspirin EC 81 MG tablet Take 81 mg by mouth daily.   Yes [provider]  diclofenac (VOLTAREN) 75 MG EC tablet Take 1 tablet (75 mg total) by mouth 2 (two) times daily. Take with food.  Don't take any other  anti-inflammatories with this medicine.  Tylenol only if you need additional medicine 08/26/20  Yes Delton See, MD  ferrous sulfate 325 (65 FE) MG tablet Take 325 mg by mouth daily with breakfast.   Yes [provider]  Multiple Vitamins-Minerals (MULTIVITAMIN ADULT PO) Take 1 tablet by mouth.   Yes [provider]  Omega-3 Fatty Acids (FISH OIL) 1000 MG CAPS Take by mouth.   Yes [provider]  OVER THE COUNTER MEDICATION APPLE CIDER VINEGAR GUMMIES   Yes [provider]  Spacer/Aero-Holding Chambers (AEROCHAMBER PLUS) inhaler Use as instructed 10/21/19  Yes Domenick Gong, MD  benzonatate (TESSALON) 200 MG capsule Take 1 capsule (200 mg total) by mouth 3 (three) times daily as needed for cough. 10/21/19   Domenick Gong, MD  fluticasone (FLONASE) 50 MCG/ACT nasal spray Place 2 sprays into both nostrils daily. 10/21/19   Domenick Gong, MD  OVER THE COUNTER MEDICATION SEA MOSS    [provider]    Family History Family History  Problem Relation Age of Onset  . Cancer Mother   . Hypertension Mother   . Renal cancer Father   . Brain cancer Father     Social History Social History   Tobacco Use  .  Smoking status: Never Smoker  . Smokeless tobacco: Never Used  Vaping Use  . Vaping Use: Never used  Substance Use Topics  . Alcohol use: Yes    Comment: occasionally  . Drug use: No     Allergies   Penicillins   Review of Systems Review of Systems  Constitutional: Positive for activity change. Negative for chills, fatigue and fever.  HENT: Negative.   Eyes: Negative.   Respiratory: Negative.   Cardiovascular: Negative.   Gastrointestinal: Negative.   Genitourinary: Negative.   Musculoskeletal: Positive for arthralgias. Negative for joint swelling, myalgias, neck pain and neck stiffness.  Skin: Negative for color change, pallor, rash and wound.  Neurological: Negative for dizziness, syncope, light-headedness, numbness and  headaches.  All other systems reviewed and are negative.    Physical Exam Triage Vital Signs ED Triage Vitals  Enc Vitals Group     BP 08/26/20 0904 130/87     Pulse Rate 08/26/20 0904 82     Resp 08/26/20 0904 18     Temp 08/26/20 0904 98.2 F (36.8 C)     Temp Source 08/26/20 0904 Oral     SpO2 08/26/20 0904 100 %     Weight 08/26/20 0902 (!) 322 lb 15.6 oz (146.5 kg)     Height 08/26/20 0902 5\' 8"  (1.727 m)     Head Circumference --      Peak Flow --      Pain Score 08/26/20 0902 10     Pain Loc --      Pain Edu? --      Excl. in GC? --    No data found.  Updated Vital Signs BP 130/87 (BP Location: Left Arm)   Pulse 82   Temp 98.2 F (36.8 C) (Oral)   Resp 18   Ht 5\' 8"  (1.727 m)   Wt (!) 146.5 kg   LMP 07/31/2020   SpO2 100%   BMI 49.11 kg/m   Visual Acuity Right Eye Distance:   Left Eye Distance:   Bilateral Distance:    Right Eye Near:   Left Eye Near:    Bilateral Near:     Physical Exam Vitals reviewed.  Constitutional:      Appearance: Normal appearance. She is not ill-appearing.     Comments: Patient is uncomfortable with any movement of her right upper extremity.  She is crying at times throughout the history and examination.  HENT:     Head: Normocephalic and atraumatic.  Cardiovascular:     Rate and Rhythm: Normal rate and regular rhythm.     Pulses: Normal pulses.     Heart sounds: Normal heart sounds. No murmur heard. No friction rub. No gallop.   Pulmonary:     Effort: Pulmonary effort is normal. No respiratory distress.     Breath sounds: Normal breath sounds. No stridor. No wheezing, rhonchi or rales.  Musculoskeletal:     Cervical back: Normal range of motion and neck supple. No rigidity or tenderness.     Comments: Left upper extremity: Normal to inspection palpation range of motion special test.  Right upper extremity: Very limited examination.  Flexion to about 90 degrees, abduction to about 90 degrees, internal rotation to the  iliac crest, could not really perform external rotation.  Patient a lot of discomfort with the examination and crying.  Tender to palpation within the bicipital groove.  No tenderness over the [place] joint, AC joint, or entire length of the clavicle.  Rotator cuff strength  testing reveals weakness in all planes but this is effort dependent.  Again patient is in a lot of discomfort.  Unable to assess impingement.  Cannot assess the labrum either.  Biceps and triceps are intact.  Strength testing of the knees is limited again due to patient's ability to perform the examination.  No evidence of an upper extremity DVT.  Neurovascular: normal sensation 2+ pulses distally.  Neurological:     Mental Status: She is alert.      UC Treatments / Results  Labs (all labs ordered are listed, but only abnormal results are displayed) Labs Reviewed - No data to display  EKG   Radiology DG Shoulder Right  Result Date: 08/26/2020 CLINICAL DATA:  Acute right shoulder pain beginning 3 days ago. EXAM: RIGHT SHOULDER - 2+ VIEW COMPARISON:  None. FINDINGS: There is no evidence of fracture or dislocation. There is no evidence of arthropathy or other focal bone abnormality. Soft tissues are unremarkable. IMPRESSION: Negative. Electronically Signed   By: Sebastian Ache M.D.   On: 08/26/2020 10:00    Procedures Procedures (including critical care time)  Medications Ordered in UC Medications - No data to display  Initial Impression / Assessment and Plan / UC Course  I have reviewed the triage vital signs and the nursing notes.  Pertinent labs & imaging results that were available during my care of the patient were reviewed by me and considered in my medical decision making (see chart for details).  Clinical impression: Acute onset of right shoulder pain with a very limited examination.  No inciting event.  Is been going on for 3 days.  Wonder whether or not she has a calcium deposit or an osteophyte causing her to  have this discomfort.  Treatment plan: 1.  The findings and treatment plan were discussed in detail with the patient.  Patient was in agreement. 2.  Even though there was no trauma, we will go ahead and get an x-ray to assess if she has a calcium deposit or an osteophyte that orthopedics can take care of.  X-rays do not show any acute findings.  No osteophyte or calcium deposit visualized. 3.  Supportive care for now.  Will prescribe an anti-inflammatory. 4.  Recommend that she follow-up with her primary care physician to see if physical therapy or an orthopedic referral is appropriate. 5.  Educational handouts were provided for her to work on range of motion.  She needs to ice that area and continue to move it or she will stiffen up and potentially get a frozen shoulder. 6.  Gave her a work note just keeping her out of work today.  I do not see any reason to extend it. 7.  Follow-up here as needed.    Final Clinical Impressions(s) / UC Diagnoses   Final diagnoses:  Acute pain of right shoulder  Shoulder stiffness, right     Discharge Instructions     Please see attached instructions. Your x-ray showed no bone spur or calcium deposit.  No acute findings. Please follow-up with your primary care physician to see if orthopedic referral or physical therapy referral is appropriate. I did give you a prescription strength anti-inflammatory.  You cannot take Advil Motrin ibuprofen Naprosyn or Aleve.  You can take Tylenol with this medicine. I provided you a work note.  I hope you get the feeling better, Dr. Zachery Dauer    ED Prescriptions    Medication Sig Dispense Auth. Provider   diclofenac (VOLTAREN) 75 MG EC tablet  Take 1 tablet (75 mg total) by mouth 2 (two) times daily. Take with food.  Don't take any other anti-inflammatories with this medicine.  Tylenol only if you need additional medicine 60 tablet Delton See, MD     PDMP not reviewed this encounter.   Delton See,  MD 08/26/20 1006    Delton See, MD 08/26/20 [phone]

## 2020-08-26 NOTE — Discharge Instructions (Addendum)
Please see attached instructions. Your x-ray showed no bone spur or calcium deposit.  No acute findings. Please follow-up with your primary care physician to see if orthopedic referral or physical therapy referral is appropriate. I did give you a prescription strength anti-inflammatory.  You cannot take Advil Motrin ibuprofen Naprosyn or Aleve.  You can take Tylenol with this medicine. I provided you a work note.  I hope you get the feeling better, Dr. Zachery Dauer

## 2020-08-26 NOTE — ED Triage Notes (Signed)
Patient complains of right shoulder pain that started upon wakening on Monday morning. States that she has been unable to lift her arm or wipe herself. Patient reports that pain radiates down her arm some as well.

## 2020-09-17 ENCOUNTER — Ambulatory Visit
Admission: EM | Admit: 2020-09-17 | Discharge: 2020-09-17 | Disposition: A | Discharge status: Home / Self Care | Payer: Self-pay | Attending: Internal Medicine | Admitting: Internal Medicine

## 2020-09-17 ENCOUNTER — Other Ambulatory Visit: Payer: Self-pay

## 2020-09-17 DIAGNOSIS — Z1152 Encounter for screening for COVID-19: Secondary | ICD-10-CM

## 2020-09-17 DIAGNOSIS — Z01812 Encounter for preprocedural laboratory examination: Secondary | ICD-10-CM | POA: Insufficient documentation

## 2020-09-17 DIAGNOSIS — Z20822 Contact with and (suspected) exposure to covid-19: Secondary | ICD-10-CM | POA: Insufficient documentation

## 2020-09-17 LAB — SARS CORONAVIRUS 2 (TAT 6-24 HRS): SARS Coronavirus 2: NEGATIVE

## 2020-09-17 NOTE — Discharge Instructions (Signed)

## 2020-09-17 NOTE — ED Triage Notes (Signed)
Pt requesting COVID test, daughter has possible symptoms. Pt denies any COVID symptoms at this time.

## 2020-09-17 NOTE — ED Notes (Signed)
Pt requesting COVID testing. No s/s. Pt test completed.

## 2020-09-27 ENCOUNTER — Encounter: Payer: Self-pay | Admitting: Emergency Medicine

## 2020-09-27 ENCOUNTER — Ambulatory Visit
Admission: EM | Admit: 2020-09-27 | Discharge: 2020-09-27 | Disposition: A | Discharge status: Home / Self Care | Payer: Medicaid Other | Attending: Sports Medicine | Admitting: Sports Medicine

## 2020-09-27 ENCOUNTER — Other Ambulatory Visit: Payer: Self-pay

## 2020-09-27 DIAGNOSIS — R059 Cough, unspecified: Secondary | ICD-10-CM | POA: Insufficient documentation

## 2020-09-27 DIAGNOSIS — R0789 Other chest pain: Secondary | ICD-10-CM | POA: Insufficient documentation

## 2020-09-27 DIAGNOSIS — Z8709 Personal history of other diseases of the respiratory system: Secondary | ICD-10-CM

## 2020-09-27 DIAGNOSIS — Z20822 Contact with and (suspected) exposure to covid-19: Secondary | ICD-10-CM | POA: Insufficient documentation

## 2020-09-27 DIAGNOSIS — J069 Acute upper respiratory infection, unspecified: Secondary | ICD-10-CM | POA: Insufficient documentation

## 2020-09-27 DIAGNOSIS — J45909 Unspecified asthma, uncomplicated: Secondary | ICD-10-CM | POA: Insufficient documentation

## 2020-09-27 MED ORDER — ALBUTEROL SULFATE HFA 108 (90 BASE) MCG/ACT IN AERS: 1.0000 | INHALATION_SPRAY | Freq: Four times a day (QID) | RESPIRATORY_TRACT | 0 refills | Status: DC | PRN

## 2020-09-27 MED ORDER — BENZONATATE 200 MG PO CAPS: 200.0000 mg | ORAL_CAPSULE | Freq: Three times a day (TID) | ORAL | 0 refills | Status: DC | PRN

## 2020-09-27 NOTE — Discharge Instructions (Addendum)
Your Covid test is pending at the time of discharge.  It should be available later tonight.  Please log into MyChart to see your results.  If it is positive someone will contact you.  They may not always contact you if it is negative. Gave you some educational handouts. Sent in an albuterol inhaler and Tessalon Perles for your cough to the pharmacy. Gave you a work note keep you out of work today and you can return tomorrow or your next scheduled shift. Recommended Mucinex 1200 mg twice a day without the DM component. Over-the-counter meds as needed, Tylenol or Motrin for any fever or discomfort. If your symptoms worsen in any way or do not improve see your primary care physician, or go to the ER.

## 2020-09-27 NOTE — ED Triage Notes (Signed)
Pt c/o cough. Started about 3 days ago. She has tried otc cold medications.

## 2020-09-28 LAB — SARS CORONAVIRUS 2 (TAT 6-24 HRS): SARS Coronavirus 2: NEGATIVE

## 2020-09-30 NOTE — ED Provider Notes (Signed)
[facility]    CSN: [id number] Arrival date & time: 09/27/20  1503      History   Chief Complaint Chief Complaint  Patient presents with  . Cough    HPI Jessica Nguyen is a 45 y.o. female.   Patient is a pleasant 45 year old female who presents for evaluation of the above issue.  Normally sees [facility] but was unable to get into see them for an appointment.  She reports having cough for about 3 to 4 days.  A little burning in her chest.  It is a dry hacking cough nonproductive.  She has been using over-the-counter meds such as TheraFlu.  Also hot tea.  No cough medication sore throat lozenges.  No fever shakes chills.  No nausea vomiting diarrhea.  She has not been vaccinated against COVID or influenza.  She denies a smoking history or seasonal allergies.  She does have asthma.  She has albuterol MDI but has run out of her medication and has no more refills.  She denies any chest pain or shortness of breath.  No red flag signs or symptoms elicited on history.     Past Medical History:  Diagnosis Date  . Anemia   . Asthma   . Obese     There are no problems to display for this patient.   Past Surgical History:  Procedure Laterality Date  . CESAREAN SECTION    . CHOLECYSTECTOMY      OB History   No obstetric history on file.      Home Medications    Prior to Admission medications   Medication Sig Start Date End Date Taking? Authorizing Provider  Ascorbic Acid (VITAMIN C) 500 MG CAPS Take by mouth.   Yes [provider]  aspirin EC 81 MG tablet Take 81 mg by mouth daily.   Yes [provider]  ferrous sulfate 325 (65 FE) MG tablet Take 325 mg by mouth daily with breakfast.   Yes [provider]  Multiple Vitamins-Minerals (MULTIVITAMIN ADULT PO) Take 1 tablet by mouth.   Yes [provider]  Omega-3 Fatty Acids (FISH OIL) 1000 MG CAPS Take by mouth.   Yes [provider]  OVER THE COUNTER  MEDICATION APPLE CIDER VINEGAR GUMMIES   Yes [provider]  albuterol (VENTOLIN HFA) 108 (90 Base) MCG/ACT inhaler Inhale 1-2 puffs into the lungs every 6 (six) hours as needed for wheezing or shortness of breath. 09/27/20   Delton SeeBarnes, Galileo Colello, MD  benzonatate (TESSALON) 200 MG capsule Take 1 capsule (200 mg total) by mouth 3 (three) times daily as needed for cough. 09/27/20   Delton SeeBarnes, Sami Froh, MD  diclofenac (VOLTAREN) 75 MG EC tablet Take 1 tablet (75 mg total) by mouth 2 (two) times daily. Take with food.  Don't take any other anti-inflammatories with this medicine.  Tylenol only if you need additional medicine 08/26/20   Delton SeeBarnes, Terez Montee, MD  fluticasone [facility](FLONASE) 50 MCG/ACT nasal spray Place 2 sprays into both nostrils daily. 10/21/19   Domenick GongMortenson, Ashley, MD  OVER THE COUNTER MEDICATION SEA MOSS    [provider]  Spacer/Aero-Holding Chambers (AEROCHAMBER PLUS) inhaler Use as instructed 10/21/19   Domenick GongMortenson, Ashley, MD    Family History Family History  Problem Relation Age of Onset  . Cancer Mother   . Hypertension Mother   . Renal cancer Father   . Brain cancer Father     Social History Social History   Tobacco Use  . Smoking status: Never  Smoker  . Smokeless tobacco: Never Used  Vaping Use  . Vaping Use: Never used  Substance Use Topics  . Alcohol use: Yes    Comment: occasionally  . Drug use: No     Allergies   Penicillins   Review of Systems Review of Systems  Constitutional: Negative for activity change, appetite change, chills, diaphoresis, fatigue and fever.  HENT: Negative.  Negative for congestion, ear discharge, ear pain, postnasal drip, rhinorrhea, sinus pressure, sinus pain, sneezing and sore throat.   Respiratory: Positive for cough. Negative for apnea, chest tightness, shortness of breath, wheezing and stridor.   Cardiovascular: Negative.  Negative for chest pain and palpitations.  Gastrointestinal: Negative.  Negative for abdominal pain,  constipation, diarrhea, nausea and vomiting.  Genitourinary: Negative.  Negative for dysuria.  Musculoskeletal: Negative for myalgias.  Skin: Negative.  Negative for rash.  Neurological: Negative.  Negative for dizziness, syncope, light-headedness, numbness and headaches.  All other systems reviewed and are negative.    Physical Exam Triage Vital Signs ED Triage Vitals  Enc Vitals Group     BP 09/27/20 1532 (!) 143/92     Pulse Rate 09/27/20 1532 100     Resp --      Temp 09/27/20 1532 98.8 F (37.1 C)     Temp Source 09/27/20 1532 Oral     SpO2 09/27/20 1532 100 %     Weight 09/27/20 1529 (!) 311 lb 1.1 oz (141.1 kg)     Height 09/27/20 1529 5\' 8"  (1.727 m)     Head Circumference --      Peak Flow --      Pain Score 09/27/20 1529 8     Pain Loc --      Pain Edu? --      Excl. in GC? --    No data found.  Updated Vital Signs BP (!) 143/92 (BP Location: Right Arm)   Pulse 100   Temp 98.8 F (37.1 C) (Oral)   Ht 5\' 8"  (1.727 m)   Wt (!) 141.1 kg   LMP 09/10/2020 (Approximate)   SpO2 100%   BMI 47.30 kg/m   Visual Acuity Right Eye Distance:   Left Eye Distance:   Bilateral Distance:    Right Eye Near:   Left Eye Near:    Bilateral Near:     Physical Exam Vitals and nursing note reviewed.  Constitutional:      General: She is not in acute distress.    Appearance: Normal appearance. She is not ill-appearing, toxic-appearing or diaphoretic.  HENT:     Head: Normocephalic and atraumatic.     Right Ear: Tympanic membrane normal.     Left Ear: Tympanic membrane normal.     Nose: Nose normal. No congestion or rhinorrhea.     Mouth/Throat:     Mouth: Mucous membranes are moist.     Pharynx: No oropharyngeal exudate or posterior oropharyngeal erythema.  Eyes:     General: No scleral icterus.       Right eye: No discharge.        Left eye: No discharge.     Extraocular Movements: Extraocular movements intact.     Conjunctiva/sclera: Conjunctivae normal.      Pupils: Pupils are equal, round, and reactive to light.  Cardiovascular:     Rate and Rhythm: Normal rate and regular rhythm.     Pulses: Normal pulses.     Heart sounds: Normal heart sounds. No murmur heard. No friction rub. No gallop.  Pulmonary:     Effort: Pulmonary effort is normal. No respiratory distress.     Breath sounds: Normal breath sounds. No stridor. No wheezing, rhonchi or rales.  Musculoskeletal:     Cervical back: Normal range of motion and neck supple. No rigidity or tenderness.  Skin:    General: Skin is warm and dry.     Capillary Refill: Capillary refill takes less than 2 seconds.     Coloration: Skin is not jaundiced.     Findings: No bruising, lesion or rash.  Neurological:     General: No focal deficit present.     Mental Status: She is alert and oriented to person, place, and time.      UC Treatments / Results  Labs (all labs ordered are listed, but only abnormal results are displayed) Labs Reviewed  SARS CORONAVIRUS 2 (TAT 6-24 HRS)    EKG   Radiology No results found.  Procedures Procedures (including critical care time)  Medications Ordered in UC Medications - No data to display  Initial Impression / Assessment and Plan / UC Course  I have reviewed the triage vital signs and the nursing notes.  Pertinent labs & imaging results that were available during my care of the patient were reviewed by me and considered in my medical decision making (see chart for details).  Clinical impression: Nonproductive dry hacking cough for about 3 to 4 days.  Patient does have a history of asthma.  She is out of her albuterol inhaler.  Vital signs and physical exam are reassuring.  Treatment plan: 1.  The findings and treatment plan were discussed in detail with the patient.  Patient was in agreement. 2.  We will go ahead and get a Covid test.  It was pending at the time of discharge.  Someone will contact her if her results are positive. 3.  Renewed her  albuterol MDI.  She can get future refills from her primary care physician. 4.  Gave her a prescription for [employer].  No antibiotics are indicated at this time. 5.  Over-the-counter meds, Tylenol or Motrin for any fever or discomfort.  Over-the-counter cough syrup. 6.  Mucinex over-the-counter 1200 mg twice a day as needed.  Asked her to buy the Mucinex without the DM component. 7.  Educational handouts were provided. 8.  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 9.  Encourage her to get into her primary care physician's office for ongoing medical care and for follow-up should she needed.  She will see [place] back as needed.    Final Clinical Impressions(s) / UC Diagnoses   Final diagnoses:  Cough  Viral upper respiratory tract infection  Burning chest pain  History of asthma     Discharge Instructions     Your Covid test is pending at the time of discharge.  It should be available later tonight.  Please log into MyChart to see your results.  If it is positive someone will contact you.  They may not always contact you if it is negative. Gave you some educational handouts. Sent in an albuterol inhaler and Tessalon Perles for your cough to the pharmacy. Gave you a work note keep you out of work today and you can return tomorrow or your next scheduled shift. Recommended Mucinex 1200 mg twice a day without the DM component. Over-the-counter meds as needed, Tylenol or Motrin for any fever or discomfort. If your symptoms worsen in any way or do not improve see  your primary care physician, or go to the ER.    ED Prescriptions    Medication Sig Dispense Auth. Provider   benzonatate (TESSALON) 200 MG capsule Take 1 capsule (200 mg total) by mouth 3 (three) times daily as needed for cough. 30 capsule Delton See, MD   albuterol (VENTOLIN HFA) 108 (90 Base) MCG/ACT inhaler Inhale 1-2 puffs into the lungs every 6 (six) hours as needed for wheezing or shortness of breath. 18  g Delton See, MD     PDMP not reviewed this encounter.   Delton See, MD 09/30/20 [phone]

## 2020-10-25 ENCOUNTER — Encounter: Payer: Self-pay | Admitting: Emergency Medicine

## 2020-10-25 ENCOUNTER — Ambulatory Visit
Admission: EM | Admit: 2020-10-25 | Discharge: 2020-10-25 | Disposition: A | Discharge status: Home / Self Care | Payer: Medicaid Other | Attending: Physician Assistant | Admitting: Physician Assistant

## 2020-10-25 ENCOUNTER — Other Ambulatory Visit: Payer: Self-pay

## 2020-10-25 DIAGNOSIS — B373 Candidiasis of vulva and vagina: Secondary | ICD-10-CM

## 2020-10-25 DIAGNOSIS — B3731 Acute candidiasis of vulva and vagina: Secondary | ICD-10-CM

## 2020-10-25 DIAGNOSIS — N760 Acute vaginitis: Secondary | ICD-10-CM

## 2020-10-25 DIAGNOSIS — B9689 Other specified bacterial agents as the cause of diseases classified elsewhere: Secondary | ICD-10-CM

## 2020-10-25 LAB — WET PREP, GENITAL
Sperm: NONE SEEN
Trich, Wet Prep: NONE SEEN

## 2020-10-25 MED ORDER — FLUCONAZOLE 150 MG PO TABS: 150.0000 mg | ORAL_TABLET | Freq: Every day | ORAL | 0 refills | Status: DC

## 2020-10-25 MED ORDER — METRONIDAZOLE 1 % EX GEL: CUTANEOUS | 0 refills | Status: DC

## 2020-10-25 NOTE — ED Provider Notes (Signed)
[facility]    CSN: [id number] Arrival date & time: 10/25/20  1478      History   Chief Complaint Chief Complaint  Patient presents with  . Vaginal Itching    HPI Jessica Nguyen is a 45 y.o. female who presents with vaginal itching x 3 days. Denies UTI symptoms or abnormal vaginal discharge. Gwenith Daily not had sex with her husband in 2 months since he has been having chemo and a few days later she developed symptoms last week. Has not had sex with anyone else and is not concerned about STD's.     Past Medical History:  Diagnosis Date  . Anemia   . Asthma   . Obese     There are no problems to display for this patient.   Past Surgical History:  Procedure Laterality Date  . CESAREAN SECTION    . CHOLECYSTECTOMY      OB History   No obstetric history on file.      Home Medications    Prior to Admission medications   Medication Sig Start Date End Date Taking? Authorizing Provider  albuterol (VENTOLIN HFA) 108 (90 Base) MCG/ACT inhaler Inhale 1-2 puffs into the lungs every 6 (six) hours as needed for wheezing or shortness of breath. 09/27/20  Yes Delton See, MD  Ascorbic Acid (VITAMIN C) 500 MG CAPS Take by mouth.   Yes [provider]  aspirin EC 81 MG tablet Take 81 mg by mouth daily.   Yes [provider]  ferrous sulfate 325 (65 FE) MG tablet Take 325 mg by mouth daily with breakfast.   Yes [provider]  fluconazole (DIFLUCAN) 150 MG tablet Take 1 tablet (150 mg total) by mouth daily. 10/25/20  Yes Rodriguez-Southworth, Nettie Elm, PA-C  metroNIDAZOLE (METROGEL) 1 % gel One application qhs x 7 nights 10/25/20  Yes Rodriguez-Southworth, Nettie Elm, PA-C  Multiple Vitamins-Minerals (MULTIVITAMIN ADULT PO) Take 1 tablet by mouth.   Yes [provider]  Omega-3 Fatty Acids (FISH OIL) 1000 MG CAPS Take by mouth.   Yes [provider]  OVER THE COUNTER MEDICATION SEA MOSS   Yes [provider]  OVER THE COUNTER  MEDICATION APPLE CIDER VINEGAR GUMMIES   Yes [provider]  Spacer/Aero-Holding Chambers (AEROCHAMBER PLUS) inhaler Use as instructed 10/21/19  Yes Domenick Gong, MD  benzonatate (TESSALON) 200 MG capsule Take 1 capsule (200 mg total) by mouth 3 (three) times daily as needed for cough. 09/27/20   Delton See, MD  fluticasone [facility]) 50 MCG/ACT nasal spray Place 2 sprays into both nostrils daily. 10/21/19 10/25/20  Domenick Gong, MD    Family History Family History  Problem Relation Age of Onset  . Cancer Mother   . Hypertension Mother   . Renal cancer Father   . Brain cancer Father     Social History Social History   Tobacco Use  . Smoking status: Never Smoker  . Smokeless tobacco: Never Used  Vaping Use  . Vaping Use: Never used  Substance Use Topics  . Alcohol use: Yes    Comment: occasionally  . Drug use: No     Allergies   Penicillins   Review of Systems Review of Systems  Gastrointestinal: Negative for abdominal pain.  Genitourinary: Negative for genital sores, menstrual problem and vaginal discharge.       + vaginal itching     Physical Exam Triage Vital Signs ED Triage Vitals  Enc Vitals Group     BP 10/25/20 0938 (!) 159/94  Pulse Rate 10/25/20 0938 74     Resp 10/25/20 0938 18     Temp 10/25/20 0938 98.3 F (36.8 C)     Temp Source 10/25/20 0938 Oral     SpO2 10/25/20 0938 100 %     Weight 10/25/20 0935 (!) 311 lb 1.1 oz (141.1 kg)     Height 10/25/20 0935 5\' 8"  (1.727 m)     Head Circumference --      Peak Flow --      Pain Score --      Pain Loc --      Pain Edu? --      Excl. in [place]? --    No data found.  Updated Vital Signs BP (!) 159/94 (BP Location: Left Arm)   Pulse 74   Temp 98.3 F (36.8 C) (Oral)   Resp 18   Ht 5\' 8"  (1.727 m)   Wt (!) 311 lb 1.1 oz (141.1 kg)   LMP 09/28/2020 (Approximate)   SpO2 100%   BMI 47.30 kg/m   Visual Acuity Right Eye Distance:   Left Eye Distance:   Bilateral Distance:     Right Eye Near:   Left Eye Near:    Bilateral Near:     Physical Exam Vitals and nursing note reviewed.  Constitutional:      General: She is not in acute distress.    Appearance: She is obese. She is not toxic-appearing.  HENT:     Right Ear: External ear normal.     Left Ear: External ear normal.  Eyes:     General: No scleral icterus.    Conjunctiva/sclera: Conjunctivae normal.  Pulmonary:     Effort: Pulmonary effort is normal.  Musculoskeletal:        General: Normal range of motion.     Cervical back: Neck supple.  Skin:    Findings: No rash.  Neurological:     Mental Status: She is alert and oriented to person, place, and time.  Psychiatric:        Mood and Affect: Mood normal.        Behavior: Behavior normal.        Thought Content: Thought content normal.        Judgment: Judgment normal.      UC Treatments / Results  Labs (all labs ordered are listed, but only abnormal results are displayed) Labs Reviewed  WET PREP, GENITAL - Abnormal; Notable for the following components:      Result Value   Yeast Wet Prep HPF POC PRESENT (*)    Clue Cells Wet Prep HPF POC PRESENT (*)    WBC, Wet Prep HPF POC MODERATE (*)    All other components within normal limits    EKG   Radiology No results found.  Procedures Procedures (including critical care time)  Medications Ordered in UC Medications - No data to display  Initial Impression / Assessment and Plan / UC Course  I have reviewed the triage vital signs and the nursing notes. Pertinent labs results that were available during my care of the patient were reviewed by me and considered in my medical decision making (see chart for details). Has Candida vaginitis and BV. I placed her on Metrogel and Diflucan as noted. She is to take one diflucan now, and one when she finished the metrogel  Final Clinical Impressions(s) / UC Diagnoses   Final diagnoses:  Vaginal candida  Bacterial vaginitis   Discharge  Instructions   None  ED Prescriptions    Medication Sig Dispense Auth. Provider   metroNIDAZOLE (METROGEL) 1 % gel One application qhs x 7 nights 45 g Rodriguez-Southworth, Dionte Blaustein, PA-C   fluconazole (DIFLUCAN) 150 MG tablet Take 1 tablet (150 mg total) by mouth daily. 2 tablet Rodriguez-Southworth, Nettie Elm, PA-C     PDMP not reviewed this encounter.   Garey Ham, PA-C 10/25/20 1222

## 2020-10-25 NOTE — ED Triage Notes (Signed)
Pt c/o vaginal itching. Started about 3 days ago. Denies burning, or vaginal discharge.

## 2021-04-29 IMAGING — CR DG CHEST 2V
2 series · 2 of 2 positions shown · non-contrast
Comparison: None.

CLINICAL DATA: Cough.  0XSDB-X7 positive.

EXAM:
CHEST - 2 VIEW

[chest pa]
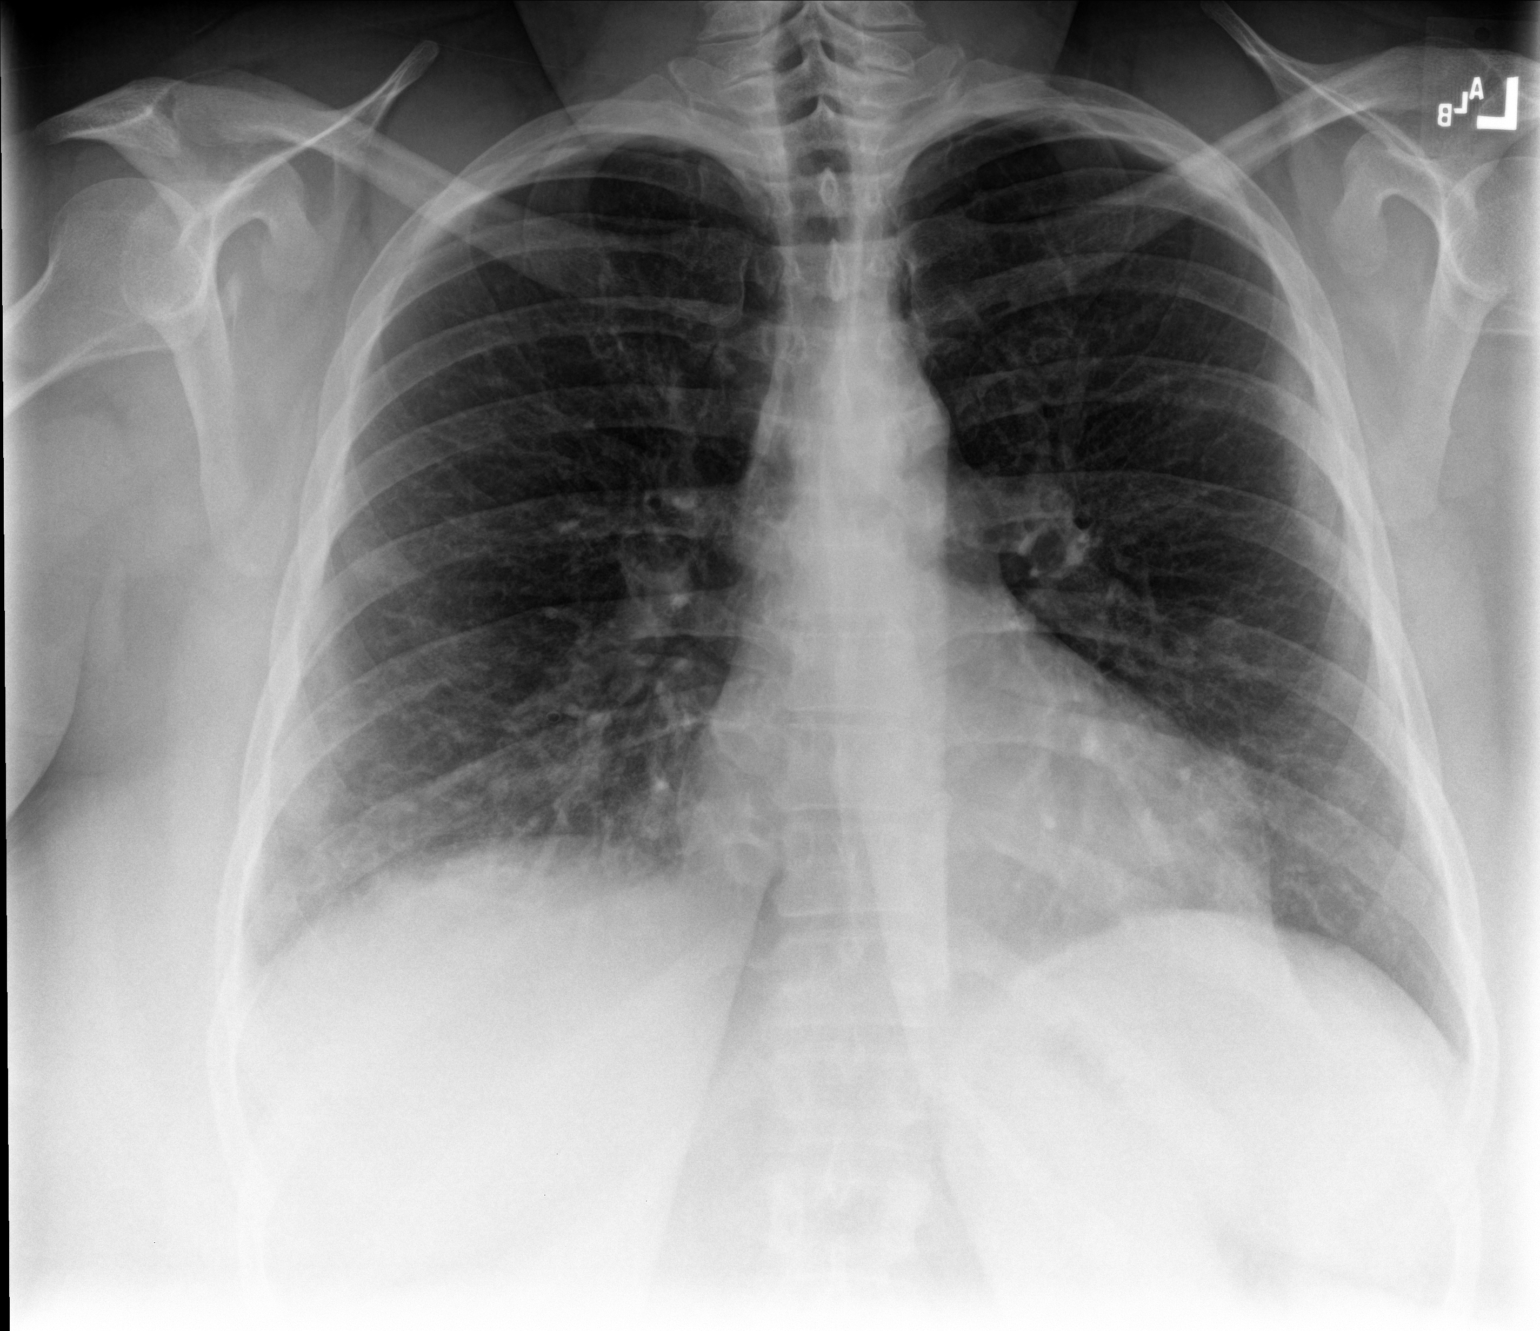

[chest lat]
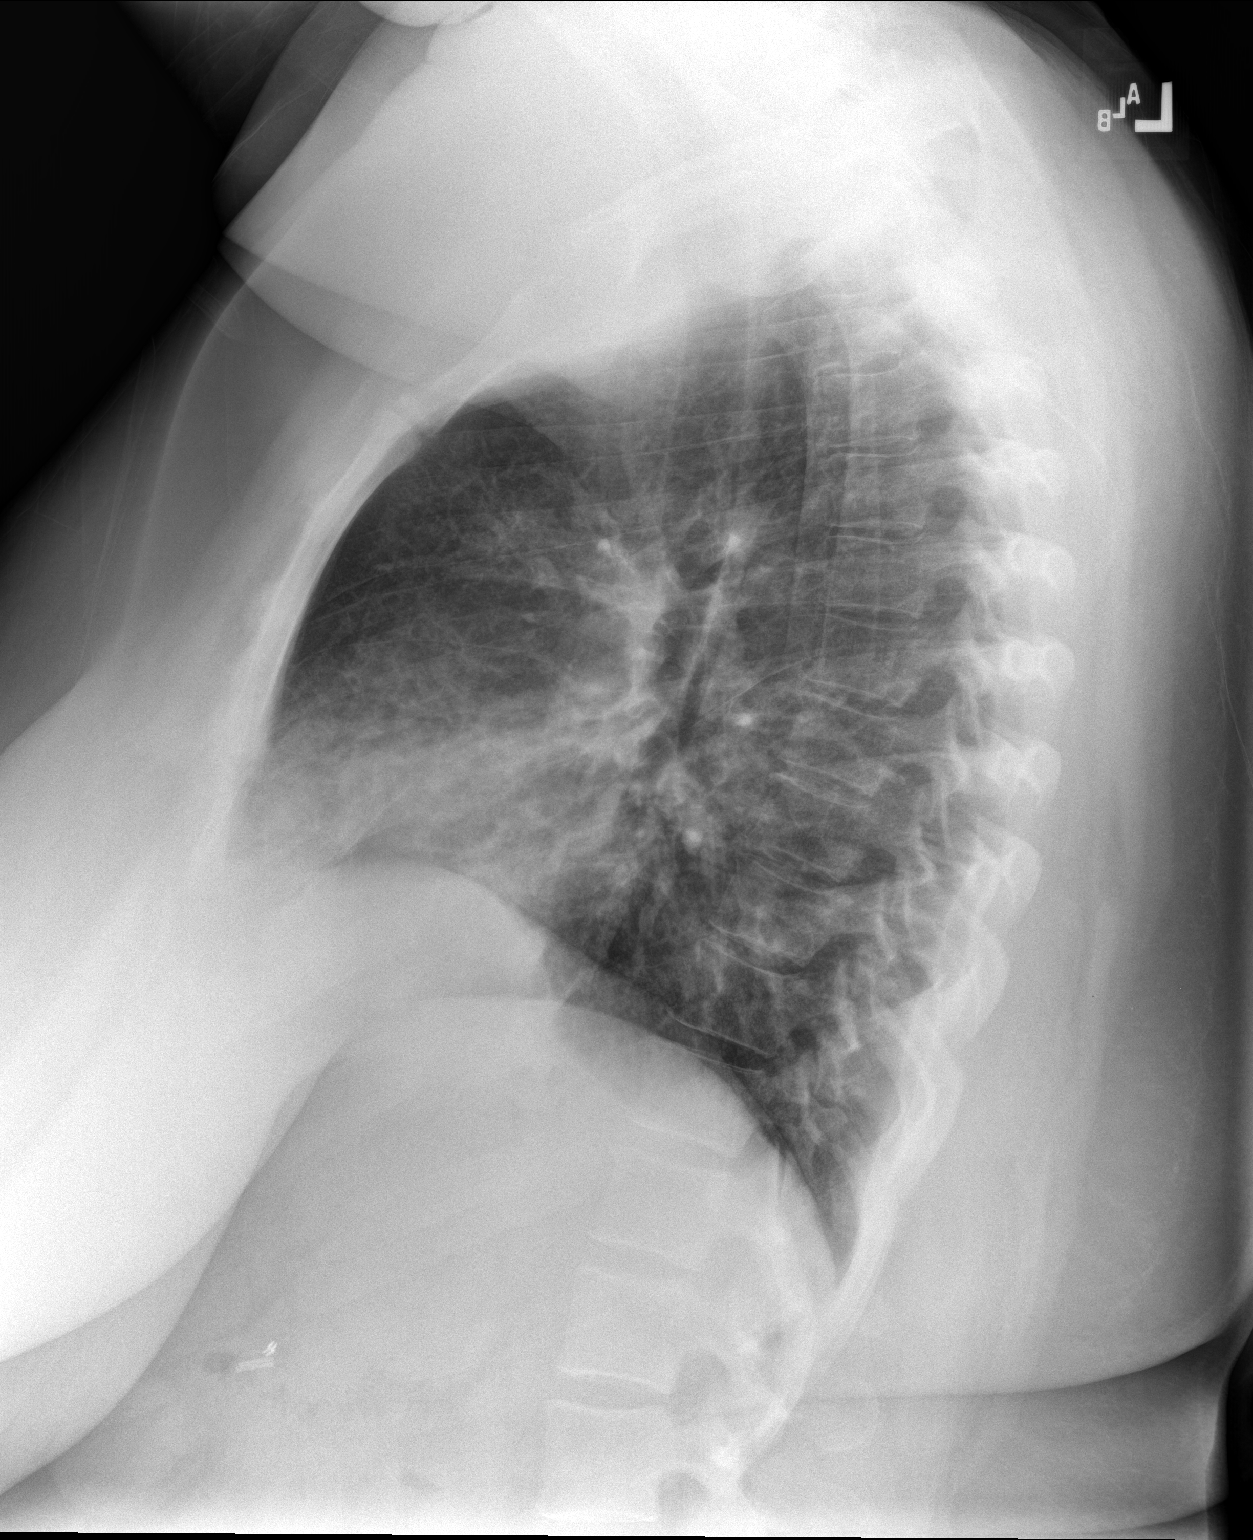

[2 of 2 positions shown; findings below may reference images not displayed]

FINDINGS: Mild bilateral pulmonary opacities are seen in the bases. The heart,
hila, mediastinum, lungs, and pleura are otherwise unremarkable.
IMPRESSION: Mild bibasilar pulmonary infiltrates consistent with the history of
0XSDB-X7. There is a peripheral predilection to the opacities.

## 2021-05-31 ENCOUNTER — Ambulatory Visit
Admission: EM | Admit: 2021-05-31 | Discharge: 2021-05-31 | Disposition: A | Discharge status: Home / Self Care | Payer: Medicaid Other | Attending: Internal Medicine | Admitting: Internal Medicine

## 2021-05-31 ENCOUNTER — Other Ambulatory Visit: Payer: Self-pay

## 2021-05-31 DIAGNOSIS — J111 Influenza due to unidentified influenza virus with other respiratory manifestations: Secondary | ICD-10-CM

## 2021-05-31 MED ORDER — BENZONATATE 200 MG PO CAPS: 200.0000 mg | ORAL_CAPSULE | Freq: Three times a day (TID) | ORAL | 1 refills | Status: DC | PRN

## 2021-05-31 MED ORDER — OSELTAMIVIR PHOSPHATE 75 MG PO CAPS: 75.0000 mg | ORAL_CAPSULE | Freq: Two times a day (BID) | ORAL | 0 refills | Status: DC

## 2021-05-31 NOTE — ED Provider Notes (Signed)
[facility]    CSN: [id number] Arrival date & time: 05/31/21  1222      History   Chief Complaint Chief Complaint  Patient presents with   Generalized Body Aches   Fever   Cough    HPI Jessica Nguyen is a 45 y.o. female.  She presents today with a fairly abrupt onset of bad sore throat, cough, body aches in the last couple of days.  She feels a little woozy.  Not much in the way of runny nose, not much congestion, no earache.  No nausea/vomiting, does have decreased appetite.  No diarrhea.  She has had fever, starting today. Her husband had a positive COVID test at home on 11/10; the patient had a negative home COVID test today.  Patient needs a work note. HPI  Past Medical History:  Diagnosis Date   Anemia    Asthma    Obese   Patient describes receiving infusions of iron for anemia in the past.  There are no problems to display for this patient.   Past Surgical History:  Procedure Laterality Date   CESAREAN SECTION     CHOLECYSTECTOMY        Home Medications    Prior to Admission medications   Medication Sig Start Date End Date Taking? Authorizing Provider  albuterol (VENTOLIN HFA) 108 (90 Base) MCG/ACT inhaler Inhale 1-2 puffs into the lungs every 6 (six) hours as needed for wheezing or shortness of breath. 09/27/20  Yes Delton See, MD  Ascorbic Acid (VITAMIN C) 500 MG CAPS Take by mouth.   Yes [provider]  aspirin EC 81 MG tablet Take 81 mg by mouth daily.   Yes [provider]  benzonatate (TESSALON) 200 MG capsule Take 1 capsule (200 mg total) by mouth 3 (three) times daily as needed for cough. 05/31/21  Yes Isa Rankin, MD  ferrous sulfate 325 (65 FE) MG tablet Take 325 mg by mouth daily with breakfast.   Yes [provider]  Multiple Vitamins-Minerals (MULTIVITAMIN ADULT PO) Take 1 tablet by mouth.   Yes [provider]  Omega-3 Fatty Acids (FISH OIL) 1000 MG CAPS Take by mouth.   Yes  [provider]  oseltamivir (TAMIFLU) 75 MG capsule Take 1 capsule (75 mg total) by mouth every 12 (twelve) hours. 05/31/21  Yes Isa Rankin, MD  fluticasone [facility]) 50 MCG/ACT nasal spray Place 2 sprays into both nostrils daily. 10/21/19 10/25/20  Domenick Gong, MD    Family History Family History  Problem Relation Age of Onset   Cancer Mother    Hypertension Mother    Renal cancer Father    Brain cancer Father     Social History Social History   Tobacco Use   Smoking status: Never   Smokeless tobacco: Never  Vaping Use   Vaping Use: Never used  Substance Use Topics   Alcohol use: Yes    Comment: occasionally   Drug use: No     Allergies   Penicillins Penicillins cause swelling  Review of Systems Review of Systems see HPI   Physical Exam Triage Vital Signs ED Triage Vitals  Enc Vitals Group     BP 05/31/21 1339 (!) 150/90     Pulse Rate 05/31/21 1339 (!) 110     Resp 05/31/21 1339 18     Temp 05/31/21 1339 (!) 100.7 F (38.2 C)     Temp src --      SpO2 05/31/21 1339 100 %  Weight 05/31/21 1337 (!) 317 lb (143.8 kg)     Height 05/31/21 1337 5\' 8"  (1.727 m)     Pain Score 05/31/21 1337 8     Pain Loc --     Updated Vital Signs BP (!) 150/90 (BP Location: Left Arm)   Pulse (!) 110   Temp (!) 100.7 F (38.2 C)   Resp 18   Ht 5\' 8"  (1.727 m)   Wt (!) 143.8 kg   LMP 04/10/2021   SpO2 100%   BMI 48.20 kg/m      Physical Exam Vitals and nursing note reviewed.  Constitutional:      General: She is not in acute distress.    Appearance: She is ill-appearing. She is not toxic-appearing.     Comments: Wearing pajamas  HENT:     Head: Atraumatic.     Comments: Bilateral TMs moderately dull, no erythema Moderate nasal congestion bilaterally Throat moderately injected Eyes:     Conjunctiva/sclera:     Right eye: Right conjunctiva is not injected. No exudate.    Left eye: Left conjunctiva is not injected. No exudate.     Comments: Conjugate gaze observed, no eye redness/discharge  Cardiovascular:     Rate and Rhythm: Regular rhythm. Tachycardia present.     Heart sounds: No murmur heard.    Comments: Heart rate 110s Pulmonary:     Effort: No respiratory distress.     Breath sounds: No wheezing or rhonchi.     Comments: Lungs clear throughout, no coarseness, occasional coughing Abdominal:     General: There is no distension.  Musculoskeletal:        General: Normal range of motion.     Cervical back: Neck supple.  Skin:    General: Skin is warm and dry.     Coloration: Skin is not cyanotic.     Comments: No edema  Neurological:     Mental Status: She is alert and oriented to person, place, and time.     Comments: Patient was able to walk into the urgent care independently, and climb on/off the exam table without assistance.  She was reclining when initially seen.  Speech is clear, coherent, logical.     UC Treatments / Results  Labs (all labs ordered are listed, but only abnormal results are displayed) Labs Reviewed - No data to display Labs not done/indicated in the urgent care today    Radiology No results found. Imaging not done/indicated in the urgent care today  Medications Ordered in UC Medications - No data to display No meds given at the urgent care today   Final Clinical Impressions(s) / UC Diagnoses   Final diagnoses:  Influenza-like illness     Discharge Instructions      Most likely explanation for symptoms today is influenza like illness.  Prescription for oseltamivir (flu medicine) and benzonatate (for cough) were sent to the pharmacy.  Anticipate gradual improvement in symptoms over the next several days.  Rest and push fluids.  Note for work, return to work 06/03/21.  Eat fruits and vegetables to help your immune system do its work.  Recheck for new/persistent fever >100.5, increasing phlegm production/nasal discharge, or if not starting to improve as  expected.   ED Prescriptions     Medication Sig Dispense Auth. Provider   benzonatate (TESSALON) 200 MG capsule Take 1 capsule (200 mg total) by mouth 3 (three) times daily as needed for cough. 30 capsule 04/12/2021, MD   oseltamivir (TAMIFLU)  75 MG capsule Take 1 capsule (75 mg total) by mouth every 12 (twelve) hours. 10 capsule Isa Rankin, MD      PDMP not reviewed this encounter.   Isa Rankin, MD 05/31/21 [phone]

## 2021-05-31 NOTE — ED Triage Notes (Signed)
Pt here with C/O fever, cough, sore throat and chills for 3 days.

## 2021-05-31 NOTE — Discharge Instructions (Signed)
Most likely explanation for symptoms today is influenza like illness.  Prescription for oseltamivir (flu medicine) and benzonatate (for cough) were sent to the pharmacy.  Anticipate gradual improvement in symptoms over the next several days.  Rest and push fluids.  Note for work, return to work 06/03/21.  Eat fruits and vegetables to help your immune system do its work.  Recheck for new/persistent fever >100.5, increasing phlegm production/nasal discharge, or if not starting to improve as expected.

## 2021-09-15 ENCOUNTER — Other Ambulatory Visit: Payer: Self-pay

## 2021-09-15 ENCOUNTER — Ambulatory Visit
Admission: EM | Admit: 2021-09-15 | Discharge: 2021-09-15 | Disposition: A | Discharge status: Home / Self Care | Payer: BC Managed Care – PPO | Attending: Emergency Medicine | Admitting: Emergency Medicine

## 2021-09-15 ENCOUNTER — Encounter: Payer: Self-pay | Admitting: Emergency Medicine

## 2021-09-15 DIAGNOSIS — J069 Acute upper respiratory infection, unspecified: Secondary | ICD-10-CM | POA: Diagnosis not present

## 2021-09-15 LAB — GROUP A STREP BY PCR: Group A Strep by PCR: NOT DETECTED

## 2021-09-15 MED ORDER — PROMETHAZINE-DM 6.25-15 MG/5ML PO SYRP: 5.0000 mL | ORAL_SOLUTION | Freq: Four times a day (QID) | ORAL | 0 refills | Status: DC | PRN

## 2021-09-15 MED ORDER — BENZONATATE 100 MG PO CAPS: 200.0000 mg | ORAL_CAPSULE | Freq: Three times a day (TID) | ORAL | 0 refills | Status: DC

## 2021-09-15 MED ORDER — IPRATROPIUM BROMIDE 0.06 % NA SOLN: 2.0000 | Freq: Four times a day (QID) | NASAL | 12 refills | Status: DC

## 2021-09-15 NOTE — ED Provider Notes (Signed)
[facility]    CSN: [id number] Arrival date & time: 09/15/21  [id number]      History   Chief Complaint Chief Complaint  Patient presents with   Nasal Congestion   Sore Throat    HPI Jessica Nguyen is a 46 y.o. female.   HPI  46 year old female here for evaluation of respiratory complaints.  Today she is experiencing nasal congestion with clear nasal discharge, ear pressure, scratchy throat, and a nonproductive cough.  Patient also endorses some wheezing and the need to use her rescue inhaler.  Patient does have a history of asthma.  She denies any fever, shortness of breath, GI complaints, body aches, or headaches.  Past Medical History:  Diagnosis Date   Anemia    Asthma    Obese     There are no problems to display for this patient.   Past Surgical History:  Procedure Laterality Date   CESAREAN SECTION     CHOLECYSTECTOMY      OB History   No obstetric history on file.      Home Medications    Prior to Admission medications   Medication Sig Start Date End Date Taking? Authorizing Provider  benzonatate (TESSALON) 100 MG capsule Take 2 capsules (200 mg total) by mouth every 8 (eight) hours. 09/15/21  Yes Margarette Canada, NP  ipratropium (ATROVENT) 0.06 % nasal spray Place 2 sprays into both nostrils 4 (four) times daily. 09/15/21  Yes Margarette Canada, NP  promethazine-dextromethorphan (PROMETHAZINE-DM) 6.25-15 MG/5ML syrup Take 5 mLs by mouth 4 (four) times daily as needed. 09/15/21  Yes Margarette Canada, NP  albuterol (VENTOLIN HFA) 108 (90 Base) MCG/ACT inhaler Inhale 1-2 puffs into the lungs every 6 (six) hours as needed for wheezing or shortness of breath. 09/27/20   Verda Cumins, MD  Ascorbic Acid (VITAMIN C) 500 MG CAPS Take by mouth.    [provider]  aspirin EC 81 MG tablet Take 81 mg by mouth daily.    [provider]  ferrous sulfate 325 (65 FE) MG tablet Take 325 mg by mouth daily with breakfast.    [provider]  Multiple  Vitamins-Minerals (MULTIVITAMIN ADULT PO) Take 1 tablet by mouth.    [provider]  Omega-3 Fatty Acids (FISH OIL) 1000 MG CAPS Take by mouth.    [provider]  fluticasone (FLONASE) 50 MCG/ACT nasal spray Place 2 sprays into both nostrils daily. 10/21/19 10/25/20  Melynda Ripple, MD    Family History Family History  Problem Relation Age of Onset   Cancer Mother    Hypertension Mother    Renal cancer Father    Brain cancer Father     Social History Social History   Tobacco Use   Smoking status: Never   Smokeless tobacco: Never  Vaping Use   Vaping Use: Never used  Substance Use Topics   Alcohol use: Yes    Comment: occasionally   Drug use: No     Allergies   Penicillins   Review of Systems Review of Systems  Constitutional:  Negative for fever.  HENT:  Positive for congestion, ear pain, rhinorrhea and sore throat. Negative for ear discharge.   Respiratory:  Positive for cough and wheezing. Negative for shortness of breath.   Gastrointestinal:  Negative for diarrhea, nausea and vomiting.  Musculoskeletal:  Negative for arthralgias and myalgias.  Skin:  Negative for rash.  Neurological:  Negative for headaches.  Hematological: Negative.   Psychiatric/Behavioral: Negative.      Physical  Exam Triage Vital Signs ED Triage Vitals  Enc Vitals Group     BP 09/15/21 0946 (!) 140/95     Pulse Rate 09/15/21 0946 82     Resp 09/15/21 0946 18     Temp 09/15/21 0946 98.8 F (37.1 C)     Temp Source 09/15/21 0946 Oral     SpO2 09/15/21 0946 100 %     Weight 09/15/21 0945 (!) 320 lb (145.2 kg)     Height 09/15/21 0945 5\' 8"  (1.727 m)     Head Circumference --      Peak Flow --      Pain Score 09/15/21 0944 0     Pain Loc --      Pain Edu? --      Excl. in [place]? --    No data found.  Updated Vital Signs BP (!) 140/95 (BP Location: Right Arm)    Pulse 82    Temp 98.8 F (37.1 C) (Oral)    Resp 18    Ht 5\' 8"  (1.727 m)    Wt (!) 320 lb (145.2 kg)     SpO2 100%    BMI 48.66 kg/m   Visual Acuity Right Eye Distance:   Left Eye Distance:   Bilateral Distance:    Right Eye Near:   Left Eye Near:    Bilateral Near:     Physical Exam Vitals and nursing note reviewed.  Constitutional:      Appearance: Normal appearance. She is not ill-appearing.  HENT:     Head: Normocephalic and atraumatic.     Right Ear: Tympanic membrane and external ear normal.     Left Ear: Tympanic membrane and external ear normal.     Ears:     Comments: Bilateral ear canals are largely occluded by wax but I am able to visualize the superior aspect of both tympanic membranes which are pearly gray in appearance.    Nose: Congestion and rhinorrhea present.     Mouth/Throat:     Mouth: Mucous membranes are moist.     Pharynx: Oropharynx is clear. Posterior oropharyngeal erythema present. No oropharyngeal exudate.  Cardiovascular:     Rate and Rhythm: Normal rate and regular rhythm.     Pulses: Normal pulses.     Heart sounds: No murmur heard.   No friction rub. No gallop.  Pulmonary:     Effort: Pulmonary effort is normal.     Breath sounds: Normal breath sounds. No wheezing, rhonchi or rales.  Musculoskeletal:     Cervical back: Normal range of motion and neck supple.  Lymphadenopathy:     Cervical: No cervical adenopathy.  Skin:    General: Skin is warm and dry.     Capillary Refill: Capillary refill takes less than 2 seconds.     Findings: No erythema or rash.  Neurological:     General: No focal deficit present.     Mental Status: She is alert and oriented to person, place, and time.  Psychiatric:        Mood and Affect: Mood normal.        Behavior: Behavior normal.        Thought Content: Thought content normal.        Judgment: Judgment normal.     UC Treatments / Results  Labs (all labs ordered are listed, but only abnormal results are displayed) Labs Reviewed  GROUP A STREP BY PCR    EKG   Radiology No results  found.  Procedures Procedures (including critical care time)  Medications Ordered in UC Medications - No data to display  Initial Impression / Assessment and Plan / UC Course  I have reviewed the triage vital signs and the nursing notes.  Pertinent labs & imaging results that were available during my care of the patient were reviewed by me and considered in my medical decision making (see chart for details).  Patient is a nontoxic-appearing 46 year old female here for evaluation of respiratory complaints as outlined HPI above.  On physical exam both of her external auditory canals are largely occluded by cerumen but I am able to visualize the superior aspect of both tympanic membranes which are pearly gray in appearance.  I offered the patient ear lavage to help clear the wax as this may relieve her ear pressure and she declined.  Her nasal mucosa is erythematous and edematous with clear discharge in both nares.  Oropharyngeal exam reveals 1+ edema to bilateral tonsillar pillars with erythema.  No exudate noted.  No cervical lymphadenopathy appreciated on exam.  Cardiopulmonary exam reveals clear lung sounds in all fields.  Strep PCR was collected in triage and is pending.  Strep PCR is negative.  We will discharge patient home with a diagnosis of viral URI with a cough.  I will give Atrovent nasal spray, Tessalon Perles, and Promethazine DM cough syrup.  Patient to continue using her inhaler as needed.  Return precautions reviewed.   Final Clinical Impressions(s) / UC Diagnoses   Final diagnoses:  Viral URI with cough     Discharge Instructions      Use the Atrovent nasal spray, 2 squirts in each nostril every 6 hours, as needed for runny nose and postnasal drip.  Use the Tessalon Perles every 8 hours during the day.  Take them with a small sip of water.  They may give you some numbness to the base of your tongue or a metallic taste in your mouth, this is normal.  Use the  Promethazine DM cough syrup at bedtime for cough and congestion.  It will make you drowsy so do not take it during the day.  Continue to use your albuterol inhaler as needed for shortness of breath and wheezing.  You can gargle with warm salt water 2-3 times a day to help soothe your throat as I suspect the postnasal drip is what is causing your discomfort.  You can also use over-the-counter Chloraseptic or Sucrets lozenges to soothe your throat.  Tylenol and ibuprofen as needed for pain as well.  Use according to the package instructions.  Return for reevaluation or see your primary care provider for any new or worsening symptoms.      ED Prescriptions     Medication Sig Dispense Auth. Provider   benzonatate (TESSALON) 100 MG capsule Take 2 capsules (200 mg total) by mouth every 8 (eight) hours. 21 capsule Margarette Canada, NP   ipratropium (ATROVENT) 0.06 % nasal spray Place 2 sprays into both nostrils 4 (four) times daily. 15 mL Margarette Canada, NP   promethazine-dextromethorphan (PROMETHAZINE-DM) 6.25-15 MG/5ML syrup Take 5 mLs by mouth 4 (four) times daily as needed. 118 mL Margarette Canada, NP      PDMP not reviewed this encounter.   Margarette Canada, NP 09/15/21 1056

## 2021-09-15 NOTE — ED Triage Notes (Signed)
Pt reports cough, nasal congestion, scratchy throat and ear pressure x 2 days. States tried OTC mucinex.  ?

## 2021-09-15 NOTE — Discharge Instructions (Signed)
Use the Atrovent nasal spray, 2 squirts in each nostril every 6 hours, as needed for runny nose and postnasal drip. ? ?Use the Tessalon Perles every 8 hours during the day.  Take them with a small sip of water.  They may give you some numbness to the base of your tongue or a metallic taste in your mouth, this is normal. ? ?Use the Promethazine DM cough syrup at bedtime for cough and congestion.  It will make you drowsy so do not take it during the day. ? ?Continue to use your albuterol inhaler as needed for shortness of breath and wheezing. ? ?You can gargle with warm salt water 2-3 times a day to help soothe your throat as I suspect the postnasal drip is what is causing your discomfort. ? ?You can also use over-the-counter Chloraseptic or Sucrets lozenges to soothe your throat. ? ?Tylenol and ibuprofen as needed for pain as well.  Use according to the package instructions. ? ?Return for reevaluation or see your primary care provider for any new or worsening symptoms.  ?

## 2021-09-18 ENCOUNTER — Ambulatory Visit
Admission: EM | Admit: 2021-09-18 | Discharge: 2021-09-18 | Disposition: A | Discharge status: Home / Self Care | Payer: BC Managed Care – PPO | Attending: Physician Assistant | Admitting: Physician Assistant

## 2021-09-18 ENCOUNTER — Ambulatory Visit (INDEPENDENT_AMBULATORY_CARE_PROVIDER_SITE_OTHER): Payer: BC Managed Care – PPO

## 2021-09-18 ENCOUNTER — Other Ambulatory Visit: Payer: Self-pay

## 2021-09-18 DIAGNOSIS — R051 Acute cough: Secondary | ICD-10-CM

## 2021-09-18 DIAGNOSIS — B349 Viral infection, unspecified: Secondary | ICD-10-CM | POA: Diagnosis not present

## 2021-09-18 DIAGNOSIS — R059 Cough, unspecified: Secondary | ICD-10-CM

## 2021-09-18 DIAGNOSIS — J45901 Unspecified asthma with (acute) exacerbation: Secondary | ICD-10-CM | POA: Diagnosis not present

## 2021-09-18 DIAGNOSIS — R0989 Other specified symptoms and signs involving the circulatory and respiratory systems: Secondary | ICD-10-CM

## 2021-09-18 DIAGNOSIS — R079 Chest pain, unspecified: Secondary | ICD-10-CM

## 2021-09-18 MED ORDER — PREDNISONE 20 MG PO TABS: 40.0000 mg | ORAL_TABLET | Freq: Every day | ORAL | 0 refills | Status: AC

## 2021-09-18 MED ORDER — ALBUTEROL SULFATE (2.5 MG/3ML) 0.083% IN NEBU
2.5000 mg | INHALATION_SOLUTION | RESPIRATORY_TRACT | Status: AC
  Administered 2021-09-18: 2.5 mg via RESPIRATORY_TRACT

## 2021-09-18 NOTE — ED Provider Notes (Signed)
[facility]    CSN: [id number] Arrival date & time: 09/18/21  1203      History   Chief Complaint No chief complaint on file.   HPI Jessica Nguyen is a 46 y.o. female presenting for 3 to 4-day history of cough and congestion.  Patient was seen in the here 3 days ago for the symptoms.  Reported a negative at home COVID test.  Her children are sick with similar symptoms.  She was given Tessalon Perles, Promethazine DM and Atrovent nasal spray.  She says the medications are helping but she has some pressure and pain in her chest when she coughs.  Does have history of asthma.  She does not really think she is having any more breathing difficulty than normal.  No fevers.  Sore throat has improved.  HPI  Past Medical History:  Diagnosis Date   Anemia    Asthma    Obese     There are no problems to display for this patient.   Past Surgical History:  Procedure Laterality Date   CESAREAN SECTION     CHOLECYSTECTOMY      OB History   No obstetric history on file.      Home Medications    Prior to Admission medications   Medication Sig Start Date End Date Taking? Authorizing Provider  albuterol (VENTOLIN HFA) 108 (90 Base) MCG/ACT inhaler Inhale 1-2 puffs into the lungs every 6 (six) hours as needed for wheezing or shortness of breath. 09/27/20  Yes Delton See, MD  Ascorbic Acid (VITAMIN C) 500 MG CAPS Take by mouth.   Yes [provider]  aspirin EC 81 MG tablet Take 81 mg by mouth daily.   Yes [provider]  benzonatate (TESSALON) 100 MG capsule Take 2 capsules (200 mg total) by mouth every 8 (eight) hours. 09/15/21  Yes Becky Augusta, NP  ferrous sulfate 325 (65 FE) MG tablet Take 325 mg by mouth daily with breakfast.   Yes [provider]  ipratropium (ATROVENT) 0.06 % nasal spray Place 2 sprays into both nostrils 4 (four) times daily. 09/15/21  Yes Becky Augusta, NP  Multiple Vitamins-Minerals (MULTIVITAMIN ADULT PO) Take 1 tablet by  mouth.   Yes [provider]  Omega-3 Fatty Acids (FISH OIL) 1000 MG CAPS Take by mouth.   Yes [provider]  predniSONE (DELTASONE) 20 MG tablet Take 2 tablets (40 mg total) by mouth daily for 5 days. 09/18/21 09/23/21 Yes Shirlee Latch, PA-C  promethazine-dextromethorphan (PROMETHAZINE-DM) 6.25-15 MG/5ML syrup Take 5 mLs by mouth 4 (four) times daily as needed. 09/15/21  Yes Becky Augusta, NP  fluticasone (FLONASE) 50 MCG/ACT nasal spray Place 2 sprays into both nostrils daily. 10/21/19 10/25/20  Domenick Gong, MD    Family History Family History  Problem Relation Age of Onset   Cancer Mother    Hypertension Mother    Renal cancer Father    Brain cancer Father     Social History Social History   Tobacco Use   Smoking status: Never   Smokeless tobacco: Never  Vaping Use   Vaping Use: Never used  Substance Use Topics   Alcohol use: Yes    Comment: occasionally   Drug use: No     Allergies   Penicillins   Review of Systems Review of Systems  Constitutional:  Negative for chills, diaphoresis, fatigue and fever.  HENT:  Positive for congestion, postnasal drip and rhinorrhea. Negative for ear pain, sinus pressure, sinus pain and  sore throat.   Respiratory:  Positive for cough. Negative for shortness of breath and wheezing.   Cardiovascular:  Positive for chest pain.  Gastrointestinal:  Negative for abdominal pain, nausea and vomiting.  Musculoskeletal:  Negative for arthralgias and myalgias.  Skin:  Negative for rash.  Neurological:  Negative for weakness and headaches.  Hematological:  Negative for adenopathy.    Physical Exam Triage Vital Signs ED Triage Vitals  Enc Vitals Group     BP 09/18/21 1239 (!) 140/94     Pulse Rate 09/18/21 1239 85     Resp 09/18/21 1239 18     Temp 09/18/21 1239 97.6 F (36.4 C)     Temp Source 09/18/21 1239 Oral     SpO2 09/18/21 1239 99 %     Weight 09/18/21 1238 (!) 320 lb (145.2 kg)     Height 09/18/21 1238 5'  8" (1.727 m)     Head Circumference --      Peak Flow --      Pain Score 09/18/21 1238 9     Pain Loc --      Pain Edu? --      Excl. in GC? --    No data found.  Updated Vital Signs BP (!) 140/94 (BP Location: Right Arm)   Pulse 85   Temp 97.6 F (36.4 C) (Oral)   Resp 18   Ht 5\' 8"  (1.727 m)   Wt (!) 320 lb (145.2 kg)   LMP 09/18/2021   SpO2 99%   BMI 48.66 kg/m      Physical Exam Vitals and nursing note reviewed.  Constitutional:      General: She is not in acute distress.    Appearance: Normal appearance. She is ill-appearing. She is not toxic-appearing.     Comments: Coughs frequently  HENT:     Head: Normocephalic and atraumatic.     Nose: Congestion present.     Mouth/Throat:     Mouth: Mucous membranes are moist.     Pharynx: Oropharynx is clear. Posterior oropharyngeal erythema (mild with clear PND) present.  Eyes:     General: No scleral icterus.       Right eye: No discharge.        Left eye: No discharge.     Conjunctiva/sclera: Conjunctivae normal.  Cardiovascular:     Rate and Rhythm: Normal rate and regular rhythm.     Heart sounds: Normal heart sounds.  Pulmonary:     Effort: Pulmonary effort is normal. No respiratory distress.     Breath sounds: Normal breath sounds.     Comments: Decreased breath sounds throughout lung fields Musculoskeletal:     Cervical back: Neck supple.  Skin:    General: Skin is dry.  Neurological:     General: No focal deficit present.     Mental Status: She is alert. Mental status is at baseline.     Motor: No weakness.     Gait: Gait normal.  Psychiatric:        Mood and Affect: Mood normal.        Behavior: Behavior normal.        Thought Content: Thought content normal.     UC Treatments / Results  Labs (all labs ordered are listed, but only abnormal results are displayed) Labs Reviewed - No data to display  EKG   Radiology DG Chest 2 View  Result Date: 09/18/2021 CLINICAL DATA:  Chest pain. Cough  and congestion for a couple  of days. EXAM: CHEST - 2 VIEW COMPARISON:  04/15/2019 FINDINGS: Normal heart, mediastinum and hila. Clear lungs.  No pleural effusion or pneumothorax. Skeletal structures are unremarkable. IMPRESSION: Normal chest radiographs. Electronically Signed   By: Amie Portland M.D.   On: 09/18/2021 13:21    Procedures Procedures (including critical care time)  Medications Ordered in UC Medications  albuterol (PROVENTIL) (2.5 MG/3ML) 0.083% nebulizer solution 2.5 mg (2.5 mg Nebulization Given 09/18/21 1332)    Initial Impression / Assessment and Plan / UC Course  I have reviewed the triage vital signs and the nursing notes.  Pertinent labs & imaging results that were available during my care of the patient were reviewed by me and considered in my medical decision making (see chart for details).  46 year old female with history of asthma presenting for 3 to 4-day history of cough and congestion.  Patient seen a few days ago in the clinic for symptoms and was prescribed multiple medications including Promethazine DM, benzonatate and Atrovent nasal spray.  Reports improvement in cough with the medications but concerns about chest pressure and pain when she coughs.  Has been using inhalers.  Vital stable.  She is afebrile.  She is mildly ill-appearing but nontoxic.  She does have somewhat decreased breath sounds in all lung fields.  Albuterol nebulizer given to patient.  Patient reported a little bit of improvement in breathing and chest discomfort after using this.  Chest x-ray obtained and normal.  Reviewed result with patient.  Suspect patient is having a asthma flareup and also likely chest discomfort from coughing.  Advised to continue the cough medications.  Sent prednisone to pharmacy and encouraged her to continue using the inhalers.  Reviewed return and ER precautions and a work note given.   Final Clinical Impressions(s) / UC Diagnoses   Final diagnoses:  Viral  illness  Asthma with acute exacerbation, unspecified asthma severity, unspecified whether persistent  Acute cough     Discharge Instructions      URI/COLD SYMPTOMS: Your exam today is consistent with a viral illness. Antibiotics are not indicated at this time. Use medications as directed, including cough syrup, nasal saline, and decongestants. Your symptoms should improve over the next few days and resolve within another 7-10 days. Increase rest and fluids. F/u if symptoms worsen or predominate such as sore throat, ear pain, productive cough, shortness of breath, or if you develop high fevers or worsening fatigue over the next several days.       ED Prescriptions     Medication Sig Dispense Auth. Provider   predniSONE (DELTASONE) 20 MG tablet Take 2 tablets (40 mg total) by mouth daily for 5 days. 10 tablet Shirlee Latch, PA-C      I have reviewed the PDMP during this encounter.   Shirlee Latch, PA-C 09/18/21 1408

## 2021-09-18 NOTE — Discharge Instructions (Signed)

## 2021-09-18 NOTE — ED Triage Notes (Signed)
Pt was here on 09/15/21 and was prescribed tessalon, Promethazine, and atrovent. . Pt states that her chest is hurting due to the coughing and she is concerned.  ? ?Pt last took the cough medication this morning.  ?

## 2022-04-07 ENCOUNTER — Ambulatory Visit
Admission: EM | Admit: 2022-04-07 | Discharge: 2022-04-07 | Disposition: A | Discharge status: Home / Self Care | Payer: BC Managed Care – PPO | Attending: Emergency Medicine | Admitting: Emergency Medicine

## 2022-04-07 DIAGNOSIS — Z20822 Contact with and (suspected) exposure to covid-19: Secondary | ICD-10-CM | POA: Diagnosis present

## 2022-04-07 DIAGNOSIS — J069 Acute upper respiratory infection, unspecified: Secondary | ICD-10-CM

## 2022-04-07 LAB — RESP PANEL BY RT-PCR (FLU A&B, COVID) ARPGX2
Influenza A by PCR: NEGATIVE
Influenza B by PCR: NEGATIVE
SARS Coronavirus 2 by RT PCR: NEGATIVE

## 2022-04-07 MED ORDER — ALBUTEROL SULFATE HFA 108 (90 BASE) MCG/ACT IN AERS: 2.0000 | INHALATION_SPRAY | RESPIRATORY_TRACT | 0 refills | Status: DC | PRN

## 2022-04-07 MED ORDER — AEROCHAMBER MV MISC: 1 refills | Status: AC

## 2022-04-07 MED ORDER — FLUTICASONE PROPIONATE 50 MCG/ACT NA SUSP: 2.0000 | Freq: Every day | NASAL | 0 refills | Status: DC

## 2022-04-07 NOTE — ED Provider Notes (Signed)
HPI  SUBJECTIVE:  Jessica Nguyen is a 46 y.o. female who presents with 2 days of nasal congestion, sore throat, frontal sinus pain and pressure, postnasal drip.  She reports wheezing last night that resolved with a few puffs from her albuterol inhaler.  She has not required any more albuterol since then.  No fevers, body aches, rhinorrhea, facial swelling, upper dental pain, loss of sense or smell taste, cough, shortness of breath, nausea, vomiting, diarrhea, abdominal pain.  No known COVID or flu exposure.  She did not get the COVID or flu vaccines.  No antibiotics in the past month.  No antipyretic in the past 6 hours.  She has also tried ginger root tea, cough drops and Afrin.  The gingerroot helps.  No aggravating factors.  She is concerned that she has something more than a cold.  LMP: 1 week ago.  Denies possibility of being pregnant.  PCP: [facility].   Past Medical History:  Diagnosis Date   Anemia    Asthma    Obese     Past Surgical History:  Procedure Laterality Date   CESAREAN SECTION     CHOLECYSTECTOMY      Family History  Problem Relation Age of Onset   Cancer Mother    Hypertension Mother    Renal cancer Father    Brain cancer Father     Social History   Tobacco Use   Smoking status: Never   Smokeless tobacco: Never  Vaping Use   Vaping Use: Never used  Substance Use Topics   Alcohol use: Yes    Comment: occasionally   Drug use: No    No current facility-administered medications for this encounter.  Current Outpatient Medications:    fluticasone (FLONASE) 50 MCG/ACT nasal spray, Place 2 sprays into both nostrils daily., Disp: 16 g, Rfl: 0   Spacer/Aero-Holding Chambers (AEROCHAMBER MV) inhaler, Use as instructed, Disp: 1 each, Rfl: 1   albuterol (VENTOLIN HFA) 108 (90 Base) MCG/ACT inhaler, Inhale 2 puffs into the lungs every 4 (four) hours as needed for wheezing or shortness of breath., Disp: 18 g, Rfl: 0   Ascorbic Acid (VITAMIN C) 500 MG CAPS,  Take by mouth., Disp: , Rfl:    aspirin EC 81 MG tablet, Take 81 mg by mouth daily., Disp: , Rfl:    benzonatate (TESSALON) 100 MG capsule, Take 2 capsules (200 mg total) by mouth every 8 (eight) hours., Disp: 21 capsule, Rfl: 0   ferrous sulfate 325 (65 FE) MG tablet, Take 325 mg by mouth daily with breakfast., Disp: , Rfl:    Multiple Vitamins-Minerals (MULTIVITAMIN ADULT PO), Take 1 tablet by mouth., Disp: , Rfl:    Omega-3 Fatty Acids (FISH OIL) 1000 MG CAPS, Take by mouth., Disp: , Rfl:    promethazine-dextromethorphan (PROMETHAZINE-DM) 6.25-15 MG/5ML syrup, Take 5 mLs by mouth 4 (four) times daily as needed., Disp: 118 mL, Rfl: 0  Allergies  Allergen Reactions   Penicillins Swelling     ROS  As noted in HPI.   Physical Exam  BP (!) 146/86 (BP Location: Left Arm)   Pulse 78   Temp 98.1 F (36.7 C) (Oral)   Ht 5\' 8"  (1.727 m)   Wt (!) 147 kg   LMP 03/24/2022 (Approximate)   SpO2 99%   BMI 49.26 kg/m   Constitutional: Well developed, well nourished, no acute distress Eyes:  EOMI, conjunctiva normal bilaterally HENT: Normocephalic, atraumatic,mucus membranes moist.  Positive nasal congestion.  Erythematous, swollen turbinates on the  left.  No maxillary, frontal sinus tenderness.  Normal sized, nonerythematous tonsils without exudates.  Uvula midline.  Positive postnasal drip. Neck: No cervical lymphadenopathy Respiratory: Normal inspiratory effort, lungs clear bilaterally.  No anterior, lateral chest wall tenderness Cardiovascular: Normal rate, regular rhythm, no murmurs. GI: nondistended skin: No rash, skin intact Musculoskeletal: no deformities Neurologic: Alert & oriented x 3, no focal neuro deficits Psychiatric: Speech and behavior appropriate   ED Course   Medications - No data to display  Orders Placed This Encounter  Procedures   Resp Panel by RT-PCR (Flu A&B, Covid) Anterior Nasal Swab    Standing Status:   Standing    Number of Occurrences:   1     Order Specific Question:   Release to patient    Answer:   Immediate    Results for orders placed or performed during the hospital encounter of 04/07/22 (from the past 24 hour(s))  Resp Panel by RT-PCR (Flu A&B, Covid) Anterior Nasal Swab     Status: None   Collection Time: 04/07/22  9:29 AM   Specimen: Anterior Nasal Swab  Result Value Ref Range   SARS Coronavirus 2 by RT PCR NEGATIVE NEGATIVE   Influenza A by PCR NEGATIVE NEGATIVE   Influenza B by PCR NEGATIVE NEGATIVE   No results found.  ED Clinical Impression  1. Upper respiratory tract infection, unspecified type   2. Encounter for laboratory testing for COVID-19 virus      ED Assessment/Plan     Presentation consistent with a upper respiratory infection.  Checking COVID and flu.  She is a candidate for antivirals due to history of asthma and unvaccinated status.  Did not check a strep as I suspect her sore throat is mostly from postnasal drip.  She has normal tonsils without exudates, no cervical lymphadenopathy.  In the meantime, we will refill her albuterol inhaler, prescribe a spacer, have her start saline nasal occasion, Flonase, Mucinex DM.  Work note.  Follow-up with PCP as needed.  COVID, influenza negative.  Patient with a upper respiratory infection.  Plan as above.  Discussed labs, MDM, treatment plan, and plan for follow-up with patient. patient agrees with plan.   Meds ordered this encounter  Medications   albuterol (VENTOLIN HFA) 108 (90 Base) MCG/ACT inhaler    Sig: Inhale 2 puffs into the lungs every 4 (four) hours as needed for wheezing or shortness of breath.    Dispense:  18 g    Refill:  0   fluticasone (FLONASE) 50 MCG/ACT nasal spray    Sig: Place 2 sprays into both nostrils daily.    Dispense:  16 g    Refill:  0   Spacer/Aero-Holding Chambers (AEROCHAMBER MV) inhaler    Sig: Use as instructed    Dispense:  1 each    Refill:  1      *This clinic note was created using Dragon dictation  software. Therefore, there may be occasional mistakes despite careful proofreading.  ?    Melynda Ripple, MD 04/07/22 1012

## 2022-04-07 NOTE — Discharge Instructions (Addendum)
I will contact you if and only if your COVID or flu come back positive.  In the meantime, 2 puffs from your albuterol inhaler every 4-6 hours using your spacer as needed for coughing, wheezing, shortness of breath.  Saline nasal irrigation with a Milta Deiters Med rinse and distilled water as often as you want, Flonase, Mucinex DM for the cough and to help loosen the mucus.  Make sure you do not use the Afrin for more than 3 days.

## 2022-04-07 NOTE — ED Triage Notes (Signed)
Patient reports that she has had a sore throat and sinus pressure for about 3 days.

## 2022-07-02 ENCOUNTER — Ambulatory Visit
Admission: EM | Admit: 2022-07-02 | Discharge: 2022-07-02 | Disposition: A | Discharge status: Home / Self Care | Payer: BC Managed Care – PPO | Attending: Nurse Practitioner | Admitting: Nurse Practitioner

## 2022-07-02 DIAGNOSIS — J069 Acute upper respiratory infection, unspecified: Secondary | ICD-10-CM | POA: Diagnosis not present

## 2022-07-02 DIAGNOSIS — R051 Acute cough: Secondary | ICD-10-CM

## 2022-07-02 DIAGNOSIS — Z1152 Encounter for screening for COVID-19: Secondary | ICD-10-CM | POA: Diagnosis not present

## 2022-07-02 LAB — RESP PANEL BY RT-PCR (FLU A&B, COVID) ARPGX2
Influenza A by PCR: NEGATIVE
Influenza B by PCR: NEGATIVE
SARS Coronavirus 2 by RT PCR: NEGATIVE

## 2022-07-02 MED ORDER — PROMETHAZINE-DM 6.25-15 MG/5ML PO SYRP: 5.0000 mL | ORAL_SOLUTION | Freq: Four times a day (QID) | ORAL | 0 refills | Status: DC | PRN

## 2022-07-02 NOTE — Discharge Instructions (Signed)
Discussed with patient viral illness and symptomatic treatment  Cough medication as needed Rest and fluids Follow up with PCP in 2-3 days for re-check  ER precautions reviewed and pt verbalized understanding

## 2022-07-02 NOTE — ED Provider Notes (Signed)
[facility]    CSN: [id number] Arrival date & time: 07/02/22  9735      History   Chief Complaint Chief Complaint  Patient presents with   Cough    Chest hurts with coughing - Entered by patient    HPI Jessica Nguyen is a 46 y.o. female who presents for evaluation of URI symptoms for 3 days. Patient reports associated symptoms of cough, congestion. Denies N/V/D, fevers, sore throat, ear pain, body aches, shortness of breath. Patient does have a hx of asthma, No smoking.  She has an inhaler as used since symptoms began with relief in her wheezing.  No recent travel.  Husband is being treated for pneumonia.  Pt is vaccinated for COVID. Pt is vaccinated for flu this season. Pt has taken Tessalon and albuterol inhaler for symptoms. Pt has no other concerns at this time.    Cough   Past Medical History:  Diagnosis Date   Anemia    Asthma    Obese     There are no problems to display for this patient.   Past Surgical History:  Procedure Laterality Date   CESAREAN SECTION     CHOLECYSTECTOMY      OB History   No obstetric history on file.      Home Medications    Prior to Admission medications   Medication Sig Start Date End Date Taking? Authorizing Provider  albuterol (VENTOLIN HFA) 108 (90 Base) MCG/ACT inhaler Inhale 2 puffs into the lungs every 4 (four) hours as needed for wheezing or shortness of breath. 04/07/22  Yes Domenick Gong, MD  Ascorbic Acid (VITAMIN C) 500 MG CAPS Take by mouth.   Yes [provider]  aspirin EC 81 MG tablet Take 81 mg by mouth daily.   Yes [provider]  benzonatate (TESSALON) 100 MG capsule Take 2 capsules (200 mg total) by mouth every 8 (eight) hours. 09/15/21  Yes Becky Augusta, NP  fluticasone (FLONASE) 50 MCG/ACT nasal spray Place 2 sprays into both nostrils daily. 04/07/22  Yes Domenick Gong, MD  Multiple Vitamins-Minerals (MULTIVITAMIN ADULT PO) Take 1 tablet by mouth.   Yes [provider]  Omega-3 Fatty Acids (FISH OIL) 1000 MG CAPS Take by mouth.   Yes [provider]  promethazine-dextromethorphan (PROMETHAZINE-DM) 6.25-15 MG/5ML syrup Take 5 mLs by mouth 4 (four) times daily as needed for cough. 07/02/22  Yes Radford Pax, NP  Spacer/Aero-Holding Deretha Emory (AEROCHAMBER MV) inhaler Use as instructed 04/07/22  Yes Domenick Gong, MD  ferrous sulfate 325 (65 FE) MG tablet Take 325 mg by mouth daily with breakfast.    [provider]    Family History Family History  Problem Relation Age of Onset   Cancer Mother    Hypertension Mother    Renal cancer Father    Brain cancer Father     Social History Social History   Tobacco Use   Smoking status: Never   Smokeless tobacco: Never  Vaping Use   Vaping Use: Never used  Substance Use Topics   Alcohol use: Yes    Comment: occasionally   Drug use: Yes    Types: Marijuana     Allergies   Penicillins   Review of Systems Review of Systems  HENT:  Positive for congestion.   Respiratory:  Positive for cough.      Physical Exam Triage Vital Signs ED Triage Vitals  Enc Vitals Group     BP 07/02/22 0932 (!) 149/96  Pulse Rate 07/02/22 0932 85     Resp 07/02/22 0932 16     Temp 07/02/22 0932 98.8 F (37.1 C)     Temp Source 07/02/22 0932 Oral     SpO2 07/02/22 0932 96 %     Weight 07/02/22 0930 (!) 320 lb (145.2 kg)     Height 07/02/22 0930 5\' 8"  (1.727 m)     Head Circumference --      Peak Flow --      Pain Score 07/02/22 0929 8     Pain Loc --      Pain Edu? --      Excl. in GC? --    No data found.  Updated Vital Signs BP (!) 149/96 (BP Location: Left Arm)   Pulse 85   Temp 98.8 F (37.1 C) (Oral)   Resp 16   Ht 5\' 8"  (1.727 m)   Wt (!) 320 lb (145.2 kg)   SpO2 96%   BMI 48.66 kg/m   Visual Acuity Right Eye Distance:   Left Eye Distance:   Bilateral Distance:    Right Eye Near:   Left Eye Near:    Bilateral Near:     Physical Exam Vitals and  nursing note reviewed.  Constitutional:      General: She is not in acute distress.    Appearance: She is well-developed. She is not ill-appearing.  HENT:     Head: Normocephalic and atraumatic.     Right Ear: Tympanic membrane and ear canal normal.     Left Ear: Tympanic membrane and ear canal normal.     Nose: Congestion present.     Mouth/Throat:     Mouth: Mucous membranes are moist.     Pharynx: Oropharynx is clear. Uvula midline. No oropharyngeal exudate or posterior oropharyngeal erythema.     Tonsils: No tonsillar exudate or tonsillar abscesses.  Eyes:     Conjunctiva/sclera: Conjunctivae normal.     Pupils: Pupils are equal, round, and reactive to light.  Cardiovascular:     Rate and Rhythm: Normal rate and regular rhythm.     Heart sounds: Normal heart sounds.  Pulmonary:     Effort: Pulmonary effort is normal.     Breath sounds: Normal breath sounds.  Musculoskeletal:     Cervical back: Normal range of motion and neck supple.  Lymphadenopathy:     Cervical: No cervical adenopathy.  Skin:    General: Skin is warm and dry.  Neurological:     General: No focal deficit present.     Mental Status: She is alert and oriented to person, place, and time.  Psychiatric:        Mood and Affect: Mood normal.        Behavior: Behavior normal.      UC Treatments / Results  Labs (all labs ordered are listed, but only abnormal results are displayed) Labs Reviewed  RESP PANEL BY RT-PCR (FLU A&B, COVID) [id number]    EKG   Radiology No results found.  Procedures Procedures (including critical care time)  Medications Ordered in UC Medications - No data to display  Initial Impression / Assessment and Plan / UC Course  I have reviewed the triage vital signs and the nursing notes.  Pertinent labs & imaging results that were available during my care of the patient were reviewed by me and considered in my medical decision making (see chart for details).     Discussed  with patient viral illness and symptomatic  treatment  COVID and flu PCR Cough medicine as needed Rest and fluids Follow up with PCP in 2-3 days for re-check  ER precautions reviewed and pt verbalized understanding  Final Clinical Impressions(s) / UC Diagnoses   Final diagnoses:  Acute cough  Acute upper respiratory infection     Discharge Instructions      Discussed with patient viral illness and symptomatic treatment  Cough medication as needed Rest and fluids Follow up with PCP in 2-3 days for re-check  ER precautions reviewed and pt verbalized understanding    ED Prescriptions     Medication Sig Dispense Auth. Provider   promethazine-dextromethorphan (PROMETHAZINE-DM) 6.25-15 MG/5ML syrup Take 5 mLs by mouth 4 (four) times daily as needed for cough. 118 mL Radford Pax, NP      I have reviewed the PDMP during this encounter.   Radford Pax, NP 07/02/22 [phone]

## 2022-07-02 NOTE — ED Triage Notes (Signed)
Pt c/o dry cough ongoing x3 days. Pt states chest hurts when she coughs, was around husband who had pnuemonia. Otc theraflu,tessalon pearls w/minor relief.

## 2022-09-10 IMAGING — CR DG SHOULDER 2+V*R*
3 series · 3 of 3 positions shown · non-contrast
Comparison: None.

CLINICAL DATA: Acute right shoulder pain beginning 3 days ago.

EXAM:
RIGHT SHOULDER - 2+ VIEW

[shoulder grashey]
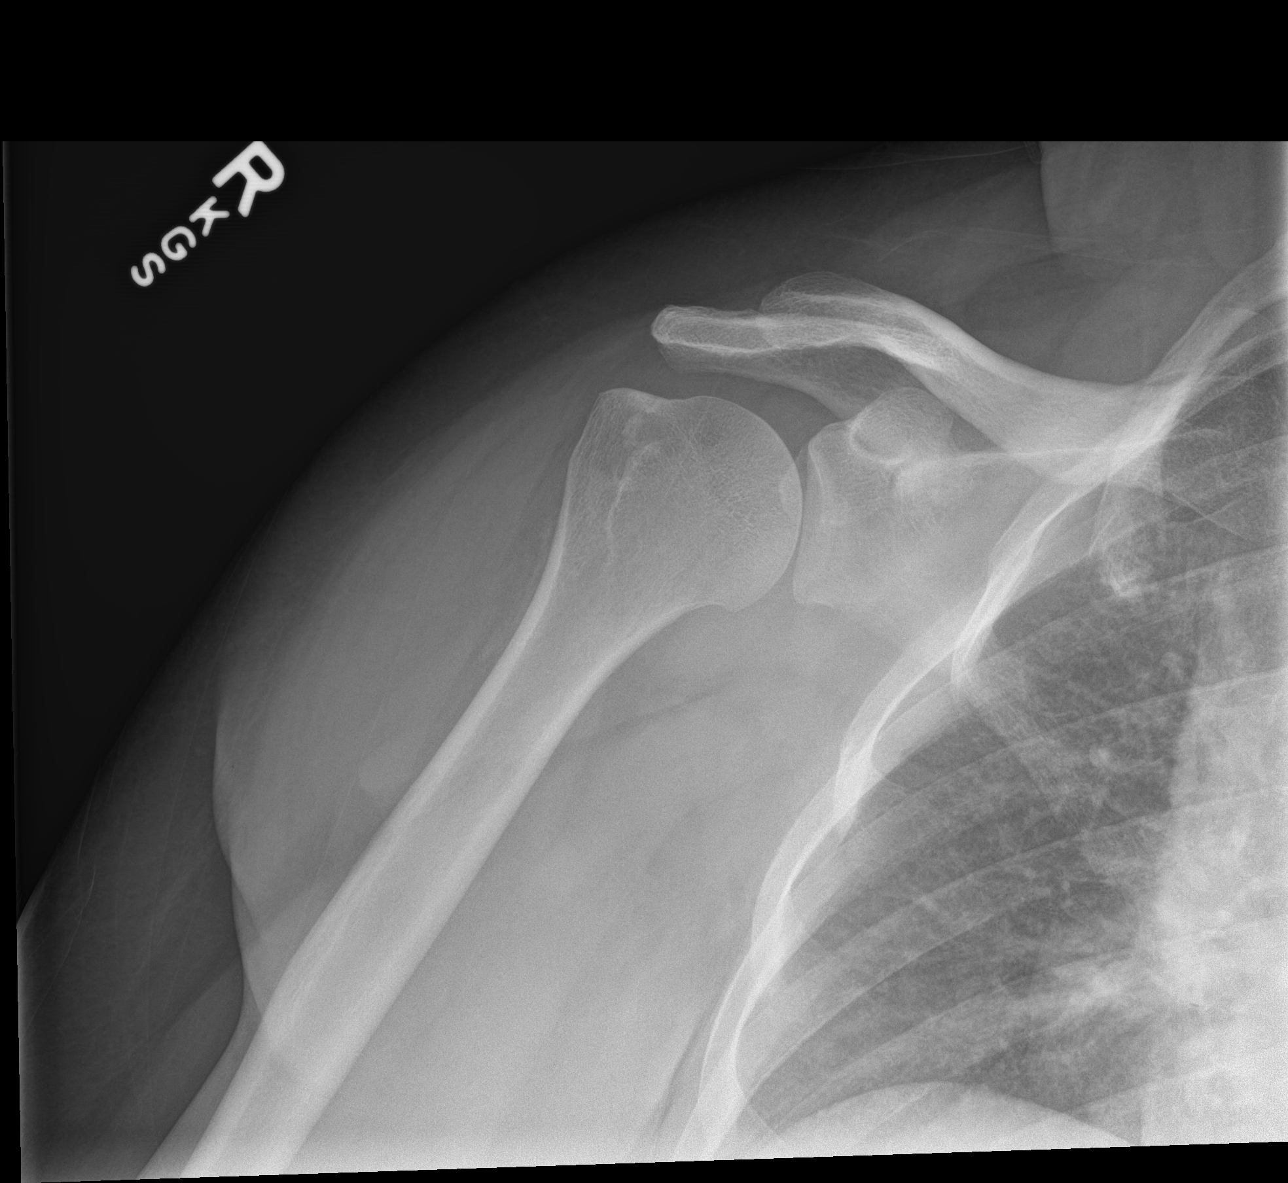

[shoulder y view]
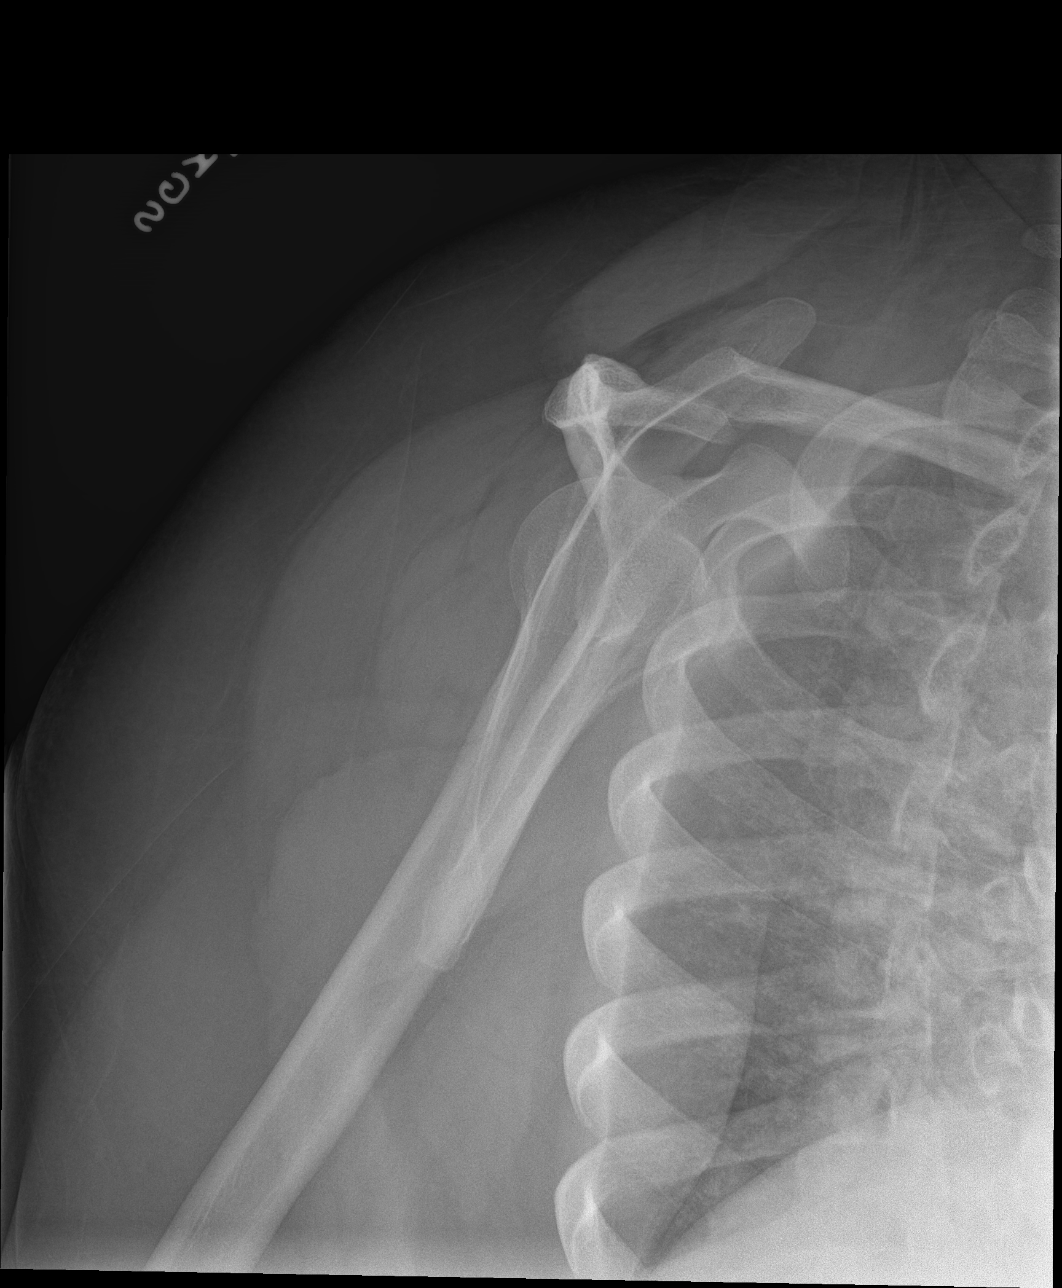

[shoulder axial]
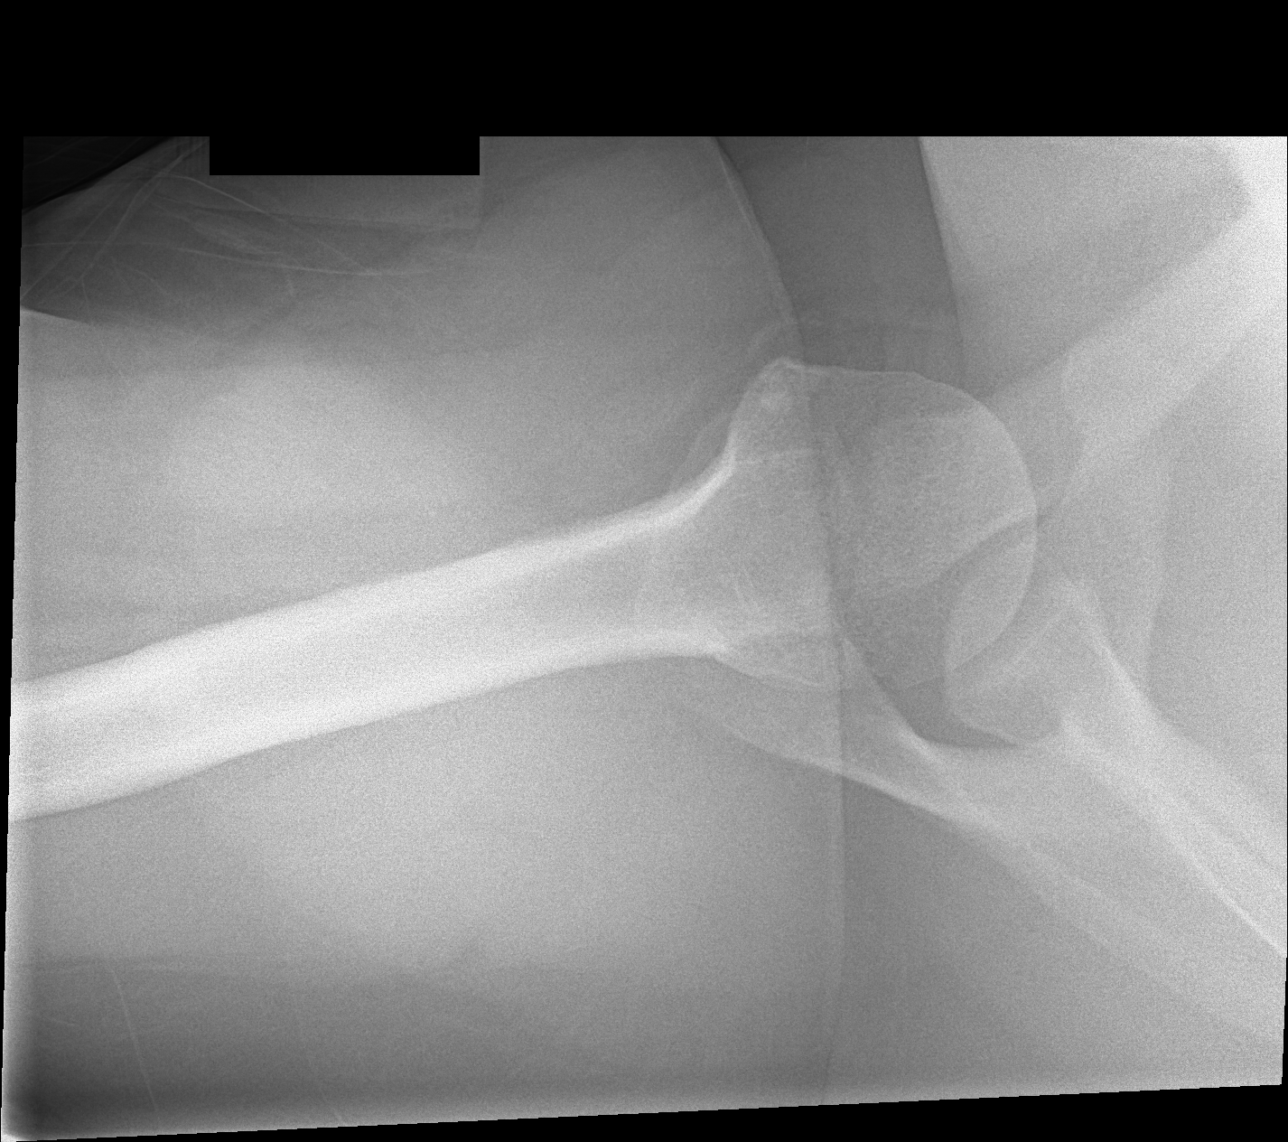

[3 of 3 positions shown; findings below may reference images not displayed]

FINDINGS: There is no evidence of fracture or dislocation. There is no
evidence of arthropathy or other focal bone abnormality. Soft
tissues are unremarkable.
IMPRESSION: Negative.

## 2022-09-14 ENCOUNTER — Encounter: Payer: Self-pay | Admitting: *Deleted

## 2022-12-30 ENCOUNTER — Ambulatory Visit
Admission: EM | Admit: 2022-12-30 | Discharge: 2022-12-30 | Disposition: A | Discharge status: Home / Self Care | Payer: BC Managed Care – PPO | Attending: Emergency Medicine | Admitting: Emergency Medicine

## 2022-12-30 DIAGNOSIS — J020 Streptococcal pharyngitis: Secondary | ICD-10-CM

## 2022-12-30 LAB — GROUP A STREP BY PCR: Group A Strep by PCR: DETECTED — AB

## 2022-12-30 MED ORDER — AZITHROMYCIN 500 MG PO TABS: 500.0000 mg | ORAL_TABLET | Freq: Every day | ORAL | 0 refills | Status: AC

## 2022-12-30 NOTE — ED Triage Notes (Signed)
Sore throat x Wednesday.

## 2022-12-30 NOTE — Discharge Instructions (Addendum)
You have strep throat, rest, push fluids, follow up with PCP. May alternate tylenol/ibuprofne as label directed for pain/fever. Warm salt water gargles, chloraseptic throat lozenges while awake.Take antibiotic as directed. Do not eat or drink after anyone, throw toothbrush away tomorrow, get new one.

## 2022-12-30 NOTE — ED Provider Notes (Signed)
[facility]    CSN: [id number] Arrival date & time: 12/30/22  0845      History   Chief Complaint Chief Complaint  Patient presents with   Sore Throat    HPI Jessica Nguyen is a 47 y.o. female.   47 year old female, Jessica Nguyen, presents to urgent care with chief complaint of sore throat for 3 days.  No treatments tried ,no known illness exposure.  The history is provided by the patient. No language interpreter was used.    Past Medical History:  Diagnosis Date   Anemia    Asthma    Obese     Patient Active Problem List   Diagnosis Date Noted   Strep throat 12/30/2022    Past Surgical History:  Procedure Laterality Date   CESAREAN SECTION     CHOLECYSTECTOMY      OB History   No obstetric history on file.      Home Medications    Prior to Admission medications   Medication Sig Start Date End Date Taking? Authorizing Provider  azithromycin (ZITHROMAX) 500 MG tablet Take 1 tablet (500 mg total) by mouth daily for 5 days. 12/30/22 01/04/23 Yes Brian Zeitlin, Para March, NP  albuterol (VENTOLIN HFA) 108 (90 Base) MCG/ACT inhaler Inhale 2 puffs into the lungs every 4 (four) hours as needed for wheezing or shortness of breath. 04/07/22   Domenick Gong, MD  Ascorbic Acid (VITAMIN C) 500 MG CAPS Take by mouth.    [provider]  aspirin EC 81 MG tablet Take 81 mg by mouth daily.    [provider]  benzonatate (TESSALON) 100 MG capsule Take 2 capsules (200 mg total) by mouth every 8 (eight) hours. 09/15/21   Becky Augusta, NP  ferrous sulfate 325 (65 FE) MG tablet Take 325 mg by mouth daily with breakfast.    [provider]  fluticasone (FLONASE) 50 MCG/ACT nasal spray Place 2 sprays into both nostrils daily. 04/07/22   Domenick Gong, MD  Multiple Vitamins-Minerals (MULTIVITAMIN ADULT PO) Take 1 tablet by mouth.    [provider]  Omega-3 Fatty Acids (FISH OIL) 1000 MG CAPS Take by mouth.    [provider]   promethazine-dextromethorphan (PROMETHAZINE-DM) 6.25-15 MG/5ML syrup Take 5 mLs by mouth 4 (four) times daily as needed for cough. 07/02/22   Radford Pax, NP  Spacer/Aero-Holding Deretha Emory (AEROCHAMBER MV) inhaler Use as instructed 04/07/22   Domenick Gong, MD    Family History Family History  Problem Relation Age of Onset   Cancer Mother    Hypertension Mother    Renal cancer Father    Brain cancer Father     Social History Social History   Tobacco Use   Smoking status: Never   Smokeless tobacco: Never  Vaping Use   Vaping Use: Never used  Substance Use Topics   Alcohol use: Yes    Comment: occasionally   Drug use: Yes    Types: Marijuana    Comment: last use a month ago     Allergies   Penicillins   Review of Systems Review of Systems  Constitutional:  Negative for fever.  HENT:  Positive for sore throat.   All other systems reviewed and are negative.    Physical Exam Triage Vital Signs ED Triage Vitals  Enc Vitals Group     BP 12/30/22 0917 134/80     Pulse Rate 12/30/22 0917 99     Resp 12/30/22 0917 18     Temp 12/30/22 0917  98.2 F (36.8 C)     Temp Source 12/30/22 0917 Oral     SpO2 12/30/22 0917 100 %     Weight 12/30/22 0915 (!) 320 lb (145.2 kg)     Height 12/30/22 0915 5\' 8"  (1.727 m)     Head Circumference --      Peak Flow --      Pain Score 12/30/22 0915 8     Pain Loc --      Pain Edu? --      Excl. in GC? --    No data found.  Updated Vital Signs BP 134/80 (BP Location: Right Arm)   Pulse 99   Temp 98.2 F (36.8 C) (Oral)   Resp 18   Ht 5\' 8"  (1.727 m)   Wt (!) 320 lb (145.2 kg)   LMP 12/06/2022   SpO2 100%   BMI 48.66 kg/m   Visual Acuity Right Eye Distance:   Left Eye Distance:   Bilateral Distance:    Right Eye Near:   Left Eye Near:    Bilateral Near:     Physical Exam Vitals and nursing note reviewed.  Constitutional:      Appearance: Normal appearance. She is well-developed and well-groomed. She is  obese.  HENT:     Head: Normocephalic.     Right Ear: Tympanic membrane is retracted.     Left Ear: Tympanic membrane is retracted.     Nose: Congestion present.     Mouth/Throat:     Lips: Pink.     Mouth: Mucous membranes are moist.     Pharynx: Posterior oropharyngeal erythema present. No pharyngeal swelling, oropharyngeal exudate or uvula swelling.  Eyes:     General: Lids are normal. Vision grossly intact.     Extraocular Movements: Extraocular movements intact.     Pupils: Pupils are equal, round, and reactive to light.  Neck:     Trachea: Trachea normal.  Cardiovascular:     Rate and Rhythm: Normal rate and regular rhythm.     Pulses: Normal pulses.     Heart sounds: Normal heart sounds.  Pulmonary:     Effort: Pulmonary effort is normal.     Breath sounds: Normal breath sounds and air entry.  Musculoskeletal:     Cervical back: Normal range of motion and neck supple.  Lymphadenopathy:     Cervical: No cervical adenopathy.  Neurological:     General: No focal deficit present.     Mental Status: She is alert and oriented to person, place, and time.     GCS: GCS eye subscore is 4. GCS verbal subscore is 5. GCS motor subscore is 6.  Psychiatric:        Attention and Perception: Attention normal.        Mood and Affect: Mood normal.        Speech: Speech normal.        Behavior: Behavior normal. Behavior is cooperative.      UC Treatments / Results  Labs (all labs ordered are listed, but only abnormal results are displayed) Labs Reviewed  GROUP A STREP BY PCR - Abnormal; Notable for the following components:      Result Value   Group A Strep by PCR DETECTED (*)    All other components within normal limits    EKG   Radiology No results found.  Procedures Procedures (including critical care time)  Medications Ordered in UC Medications - No data to display  Initial Impression /  Assessment and Plan / UC Course  I have reviewed the triage vital signs and  the nursing notes.  Pertinent labs & imaging results that were available during my care of the patient were reviewed by me and considered in my medical decision making (see chart for details).    Discussed results and plan of care with patient patient verbalized understanding this provider.   Ddx: Strep, allergies, viral illness Final Clinical Impressions(s) / UC Diagnoses   Final diagnoses:  Strep throat     Discharge Instructions      You have strep throat, rest, push fluids, follow up with PCP. May alternate tylenol/ibuprofne as label directed for pain/fever. Warm salt water gargles, chloraseptic throat lozenges while awake.Take antibiotic as directed. Do not eat or drink after anyone, throw toothbrush away tomorrow, get new one.      ED Prescriptions     Medication Sig Dispense Auth. Provider   azithromycin (ZITHROMAX) 500 MG tablet Take 1 tablet (500 mg total) by mouth daily for 5 days. 5 tablet Maddock Finigan, Para March, NP      PDMP not reviewed this encounter.   Clancy Gourd, NP 12/30/22 1016

## 2023-01-20 ENCOUNTER — Ambulatory Visit
Admission: EM | Admit: 2023-01-20 | Discharge: 2023-01-20 | Disposition: A | Discharge status: Home / Self Care | Payer: BC Managed Care – PPO | Attending: Physician Assistant | Admitting: Physician Assistant

## 2023-01-20 ENCOUNTER — Ambulatory Visit (INDEPENDENT_AMBULATORY_CARE_PROVIDER_SITE_OTHER): Payer: BC Managed Care – PPO

## 2023-01-20 DIAGNOSIS — R051 Acute cough: Secondary | ICD-10-CM | POA: Diagnosis not present

## 2023-01-20 DIAGNOSIS — J45909 Unspecified asthma, uncomplicated: Secondary | ICD-10-CM | POA: Insufficient documentation

## 2023-01-20 DIAGNOSIS — U071 COVID-19: Secondary | ICD-10-CM | POA: Diagnosis present

## 2023-01-20 DIAGNOSIS — Z760 Encounter for issue of repeat prescription: Secondary | ICD-10-CM | POA: Insufficient documentation

## 2023-01-20 LAB — SARS CORONAVIRUS 2 BY RT PCR: SARS Coronavirus 2 by RT PCR: POSITIVE — AB

## 2023-01-20 MED ORDER — PROMETHAZINE-DM 6.25-15 MG/5ML PO SYRP: 5.0000 mL | ORAL_SOLUTION | Freq: Four times a day (QID) | ORAL | 0 refills | Status: DC | PRN

## 2023-01-20 MED ORDER — ALBUTEROL SULFATE HFA 108 (90 BASE) MCG/ACT IN AERS: 1.0000 | INHALATION_SPRAY | Freq: Four times a day (QID) | RESPIRATORY_TRACT | 0 refills | Status: DC | PRN

## 2023-01-20 MED ORDER — PAXLOVID (300/100) 20 X 150 MG & 10 X 100MG PO TBPK: 3.0000 | ORAL_TABLET | Freq: Two times a day (BID) | ORAL | 0 refills | Status: AC

## 2023-01-20 NOTE — ED Triage Notes (Signed)
Fever; cough; SOB x 2days. No known sick exposure. Cough is dry. Patient has h/o asthma.   Patient tried Theraflu, motrin, albuterol inhaler with slight relief.

## 2023-01-20 NOTE — ED Provider Notes (Signed)
[facility]    CSN: [id number] Arrival date & time: 01/20/23  0836      History   Chief Complaint Chief Complaint  Patient presents with   Cough   Fever    HPI Jessica Nguyen is a 47 y.o. female presenting for 2-day history of cough, fatigue, body aches, and congestion.  Patient says her cough is nonproductive.  She denies sore throat, chest pain.  No vomiting or diarrhea.  Reports contact with a sick coworker.  Unsure as to what he was sick with.  Does have history of asthma.  She does not really think she is having any more breathing difficulty than normal. Requests refill of inhaler.  Has been taking over-the-counter TheraFlu, Motrin.  No fevers.  HPI  Past Medical History:  Diagnosis Date   Anemia    Asthma    Obese     Patient Active Problem List   Diagnosis Date Noted   Strep throat 12/30/2022     Past Surgical History:  Procedure Laterality Date   CESAREAN SECTION     CHOLECYSTECTOMY      OB History   No obstetric history on file.      Home Medications    Prior to Admission medications   Medication Sig Start Date End Date Taking? Authorizing Provider  albuterol (VENTOLIN HFA) 108 (90 Base) MCG/ACT inhaler Inhale 2 puffs into the lungs every 4 (four) hours as needed for wheezing or shortness of breath. 04/07/22  Yes Domenick Gong, MD  albuterol (VENTOLIN HFA) 108 (90 Base) MCG/ACT inhaler Inhale 1-2 puffs into the lungs every 6 (six) hours as needed for wheezing or shortness of breath. 01/20/23  Yes Shirlee Latch, PA-C  Ascorbic Acid (VITAMIN C) 500 MG CAPS Take by mouth.   Yes [provider]  aspirin EC 81 MG tablet Take 81 mg by mouth daily.   Yes [provider]  ferrous sulfate 325 (65 FE) MG tablet Take 325 mg by mouth daily with breakfast.   Yes [provider]  fluticasone (FLONASE) 50 MCG/ACT nasal spray Place 2 sprays into both nostrils daily. 04/07/22  Yes Domenick Gong, MD  Multiple Vitamins-Minerals  (MULTIVITAMIN ADULT PO) Take 1 tablet by mouth.   Yes [provider]  nirmatrelvir & ritonavir (PAXLOVID, 300/100,) 20 x 150 MG & 10 x 100MG  TBPK Take 3 tablets by mouth 2 (two) times daily for 5 days. 01/20/23 01/25/23 Yes Eusebio Friendly B, PA-C  Omega-3 Fatty Acids (FISH OIL) 1000 MG CAPS Take by mouth.   Yes [provider]  promethazine-dextromethorphan (PROMETHAZINE-DM) 6.25-15 MG/5ML syrup Take 5 mLs by mouth 4 (four) times daily as needed. 01/20/23  Yes Shirlee Latch, PA-C  Spacer/Aero-Holding Chambers (AEROCHAMBER MV) inhaler Use as instructed 04/07/22  Yes Domenick Gong, MD    Family History Family History  Problem Relation Age of Onset   Cancer Mother    Hypertension Mother    Renal cancer Father    Brain cancer Father     Social History Social History   Tobacco Use   Smoking status: Never   Smokeless tobacco: Never  Vaping Use   Vaping Use: Never used  Substance Use Topics   Alcohol use: Yes    Comment: occasionally   Drug use: Yes    Types: Marijuana    Comment: last use a month ago     Allergies   Penicillins   Review of Systems Review of Systems  Constitutional:  Positive for fatigue and  fever. Negative for chills and diaphoresis.  HENT:  Positive for congestion, postnasal drip and rhinorrhea. Negative for ear pain, sinus pressure, sinus pain and sore throat.   Respiratory:  Positive for cough. Negative for shortness of breath and wheezing.   Cardiovascular:  Positive for chest pain.  Gastrointestinal:  Negative for abdominal pain, nausea and vomiting.  Musculoskeletal:  Positive for myalgias.  Skin:  Negative for rash.  Neurological:  Negative for weakness and headaches.  Hematological:  Negative for adenopathy.     Physical Exam Triage Vital Signs ED Triage Vitals  Enc Vitals Group     BP 09/18/21 1239 (!) 140/94     Pulse Rate 09/18/21 1239 85     Resp 09/18/21 1239 18     Temp 09/18/21 1239 97.6 F (36.4 C)     Temp  Source 09/18/21 1239 Oral     SpO2 09/18/21 1239 99 %     Weight 09/18/21 1238 (!) 320 lb (145.2 kg)     Height 09/18/21 1238 5\' 8"  (1.727 m)     Head Circumference --      Peak Flow --      Pain Score 09/18/21 1238 9     Pain Loc --      Pain Edu? --      Excl. in GC? --    No data found.  Updated Vital Signs BP (!) 140/84 (BP Location: Left Arm)   Pulse 86   Temp 99 F (37.2 C) (Oral) Comment: with taking motrin at 6 am  Resp 20   Ht 5\' 8"  (1.727 m)   Wt (!) 320 lb (145.2 kg)   LMP 12/06/2022   SpO2 100%   BMI 48.66 kg/m      Physical Exam Vitals and nursing note reviewed.  Constitutional:      General: She is not in acute distress.    Appearance: Normal appearance. She is ill-appearing. She is not toxic-appearing.     Comments: Coughs frequently  HENT:     Head: Normocephalic and atraumatic.     Nose: Congestion present.     Mouth/Throat:     Mouth: Mucous membranes are moist.     Pharynx: Oropharynx is clear. Posterior oropharyngeal erythema (mild with clear PND) present.  Eyes:     General: No scleral icterus.       Right eye: No discharge.        Left eye: No discharge.     Conjunctiva/sclera: Conjunctivae normal.  Cardiovascular:     Rate and Rhythm: Normal rate and regular rhythm.     Heart sounds: Normal heart sounds.  Pulmonary:     Effort: Pulmonary effort is normal. No respiratory distress.     Breath sounds: Normal breath sounds.  Musculoskeletal:     Cervical back: Neck supple.  Skin:    General: Skin is dry.  Neurological:     General: No focal deficit present.     Mental Status: She is alert. Mental status is at baseline.     Motor: No weakness.     Gait: Gait normal.  Psychiatric:        Mood and Affect: Mood normal.        Behavior: Behavior normal.        Thought Content: Thought content normal.      UC Treatments / Results  Labs (all labs ordered are listed, but only abnormal results are displayed) Labs Reviewed  SARS  CORONAVIRUS 2 BY RT PCR -  Abnormal; Notable for the following components:      Result Value   SARS Coronavirus 2 by RT PCR POSITIVE (*)    All other components within normal limits    EKG   Radiology DG Chest 2 View  Result Date: 01/20/2023 CLINICAL DATA:  Cough, fever EXAM: CHEST - 2 VIEW COMPARISON:  09/18/2021 FINDINGS: The heart size and mediastinal contours are within normal limits. No focal airspace consolidation, pleural effusion, or pneumothorax. The visualized skeletal structures are unremarkable. IMPRESSION: No active cardiopulmonary disease. Electronically Signed   By: Duanne Guess D.O.   On: 01/20/2023 09:59    Procedures Procedures (including critical care time)  Medications Ordered in UC Medications - No data to display   Initial Impression / Assessment and Plan / UC Course  I have reviewed the triage vital signs and the nursing notes.  Pertinent labs & imaging results that were available during my care of the patient were reviewed by me and considered in my medical decision making (see chart for details).  47 year old female with history of asthma presenting for 2-day history of cough and congestion.    Vital stable.  She is afebrile.  She is mildly ill-appearing but nontoxic.  She does have somewhat decreased breath sounds in all lung fields.   COVID + Chest x-ray obtained and normal.  Reviewed result with patient.  Reviewed all results with patient.  Patient would like to start Paxlovid.  Reviewed recent GFR which was in a normal range.  Paxlovid prescribed as there are no contraindications in her case.  Also sent Promethazine DM and refilled albuterol inhaler.  Reviewed current CDC guidelines, isolation protocol.  Reviewed return and ER precautions and a work note given.  1 acute illness with systemic symptoms.  Final Clinical Impressions(s) / UC Diagnoses   Final diagnoses:  COVID-19  Acute cough  Medication refill  Asthma, unspecified asthma severity,  unspecified whether complicated, unspecified whether persistent     Discharge Instructions      -Positive COVID test. -X-ray normal. - I sent Paxlovid to the pharmacy.  I also sent cough medication and refilled your albuterol inhaler.  Increase your rest and fluids. - Isolate away from others until fever free for 24 hours and symptoms have improved.  As long as you are still coughing wear mask. - You should be seen again if you have any uncontrollable fever, weakness, breathing difficulty not relieved by use of her inhaler.       ED Prescriptions     Medication Sig Dispense Auth. Provider   promethazine-dextromethorphan (PROMETHAZINE-DM) 6.25-15 MG/5ML syrup Take 5 mLs by mouth 4 (four) times daily as needed. 118 mL Eusebio Friendly B, PA-C   albuterol (VENTOLIN HFA) 108 (90 Base) MCG/ACT inhaler Inhale 1-2 puffs into the lungs every 6 (six) hours as needed for wheezing or shortness of breath. 1 g Eusebio Friendly B, PA-C   nirmatrelvir & ritonavir (PAXLOVID, 300/100,) 20 x 150 MG & 10 x 100MG  TBPK Take 3 tablets by mouth 2 (two) times daily for 5 days. 30 tablet Gareth Morgan      PDMP not reviewed this encounter.     Shirlee Latch, PA-C 01/20/23 1013

## 2023-01-20 NOTE — Discharge Instructions (Addendum)
-  Positive COVID test. -X-ray normal. - I sent Paxlovid to the pharmacy.  I also sent cough medication and refilled your albuterol inhaler.  Increase your rest and fluids. - Isolate away from others until fever free for 24 hours and symptoms have improved.  As long as you are still coughing wear mask. - You should be seen again if you have any uncontrollable fever, weakness, breathing difficulty not relieved by use of her inhaler.

## 2023-02-09 ENCOUNTER — Encounter: Payer: Self-pay | Admitting: Emergency Medicine

## 2023-02-09 ENCOUNTER — Ambulatory Visit (INDEPENDENT_AMBULATORY_CARE_PROVIDER_SITE_OTHER): Payer: BC Managed Care – PPO

## 2023-02-09 ENCOUNTER — Ambulatory Visit
Admission: EM | Admit: 2023-02-09 | Discharge: 2023-02-09 | Disposition: A | Discharge status: Home / Self Care | Payer: BC Managed Care – PPO | Source: Home / Self Care | Attending: Family Medicine | Admitting: Family Medicine

## 2023-02-09 DIAGNOSIS — R051 Acute cough: Secondary | ICD-10-CM | POA: Diagnosis not present

## 2023-02-09 DIAGNOSIS — R059 Cough, unspecified: Secondary | ICD-10-CM

## 2023-02-09 DIAGNOSIS — J4521 Mild intermittent asthma with (acute) exacerbation: Secondary | ICD-10-CM

## 2023-02-09 MED ORDER — PROMETHAZINE-DM 6.25-15 MG/5ML PO SYRP: 5.0000 mL | ORAL_SOLUTION | Freq: Four times a day (QID) | ORAL | 0 refills | Status: DC | PRN

## 2023-02-09 MED ORDER — ALBUTEROL SULFATE HFA 108 (90 BASE) MCG/ACT IN AERS: 4.0000 | INHALATION_SPRAY | RESPIRATORY_TRACT | 0 refills | Status: DC | PRN

## 2023-02-09 MED ORDER — PREDNISONE 50 MG PO TABS: 50.0000 mg | ORAL_TABLET | Freq: Every day | ORAL | 0 refills | Status: AC

## 2023-02-09 MED ORDER — ALBUTEROL SULFATE (2.5 MG/3ML) 0.083% IN NEBU
2.5000 mg | INHALATION_SOLUTION | Freq: Once | RESPIRATORY_TRACT | Status: AC
  Administered 2023-02-09: 2.5 mg via RESPIRATORY_TRACT

## 2023-02-09 MED ORDER — BENZONATATE 100 MG PO CAPS: 100.0000 mg | ORAL_CAPSULE | Freq: Three times a day (TID) | ORAL | 0 refills | Status: DC

## 2023-02-09 NOTE — Discharge Instructions (Addendum)
Your chest-xray did not show bacterial bronchitis or pneumonia. Stop by the pharmacy to pick up your steroids and cough medications.    You can take Tylenol and/or Ibuprofen as needed for fever reduction and pain relief.    For cough: honey 1/2 to 1 teaspoon (you can dilute the honey in water or another fluid).  You can also use guaifenesin and dextromethorphan for cough. You can use a humidifier for chest congestion and cough.  If you don't have a humidifier, you can sit in the bathroom with the hot shower running.      For sore throat: try warm salt water gargles, Mucinex sore throat cough drops or cepacol lozenges, throat spray, warm tea or water with lemon/honey, popsicles or ice, or OTC cold relief medicine for throat discomfort. You can also purchase chloraseptic spray at the pharmacy or dollar store.   For congestion: take a daily anti-histamine like Zyrtec, Claritin, and a oral decongestant, such as pseudoephedrine.  You can also use Flonase 1-2 sprays in each nostril daily. Afrin is also a good option, if you do not have high blood pressure.    It is important to stay hydrated: drink plenty of fluids (water, gatorade/powerade/pedialyte, juices, or teas) to keep your throat moisturized and help further relieve irritation/discomfort.    Return or go to the Emergency Department if symptoms worsen or do not improve in the next few days

## 2023-02-09 NOTE — ED Provider Notes (Signed)
[facility]    CSN: [id number] Arrival date & time: 02/09/23  4098      History   Chief Complaint Chief Complaint  Patient presents with   Cough   Shortness of Breath    HPI Virgina Nguyen is a 47 y.o. female.   HPI  History obtained from the patient. Jessica Nguyen presents for cough since Sunday.  She got in rain at the concert.  She has been taking Mucinex and using her inhaler.  She has asthma.  Yesterday, she was at work and kept coughing and needing to use her inhaler more and more at work.  She woke up this morning not able to catch her breathe.  She using her inhaler. She had COVID on 01/20/23.  Her cough from that went totally away.  People at work are also coughing.    Fever : no  Chills: no Sore throat: scratchy from cough Cough: no Sputum: no Chest tightness: no Shortness of breath: yes Wheezing: yes Nasal congestion : yes Rhinorrhea: no Myalgias: no Appetite: normal  Hydration: normal  Abdominal pain: no Nausea: no Vomiting: no Diarrhea: No Rash: No Sleep disturbance: no Headache: no      Past Medical History:  Diagnosis Date   Anemia    Asthma    Obese     Patient Active Problem List   Diagnosis Date Noted   Strep throat 12/30/2022    Past Surgical History:  Procedure Laterality Date   CESAREAN SECTION     CHOLECYSTECTOMY      OB History   No obstetric history on file.      Home Medications    Prior to Admission medications   Medication Sig Start Date End Date Taking? Authorizing Provider  benzonatate (TESSALON) 100 MG capsule Take 1 capsule (100 mg total) by mouth every 8 (eight) hours. 02/09/23  Yes Yousaf Sainato, DO  predniSONE (DELTASONE) 50 MG tablet Take 1 tablet (50 mg total) by mouth daily for 5 days. 02/09/23 02/14/23 Yes Mieshia Pepitone, DO  albuterol (VENTOLIN HFA) 108 (90 Base) MCG/ACT inhaler Inhale 4 puffs into the lungs every 4 (four) hours as needed for wheezing or shortness of breath. 02/09/23   Dabid Godown,  Blayton Huttner, DO  Ascorbic Acid (VITAMIN C) 500 MG CAPS Take by mouth.    [provider]  aspirin EC 81 MG tablet Take 81 mg by mouth daily.    [provider]  ferrous sulfate 325 (65 FE) MG tablet Take 325 mg by mouth daily with breakfast.    [provider]  fluticasone (FLONASE) 50 MCG/ACT nasal spray Place 2 sprays into both nostrils daily. 04/07/22   Domenick Gong, MD  Multiple Vitamins-Minerals (MULTIVITAMIN ADULT PO) Take 1 tablet by mouth.    [provider]  Omega-3 Fatty Acids (FISH OIL) 1000 MG CAPS Take by mouth.    [provider]  promethazine-dextromethorphan (PROMETHAZINE-DM) 6.25-15 MG/5ML syrup Take 5 mLs by mouth 4 (four) times daily as needed. 02/09/23   Katha Cabal, DO  Spacer/Aero-Holding Chambers (AEROCHAMBER MV) inhaler Use as instructed 04/07/22   Domenick Gong, MD    Family History Family History  Problem Relation Age of Onset   Cancer Mother    Hypertension Mother    Renal cancer Father    Brain cancer Father     Social History Social History   Tobacco Use   Smoking status: Never   Smokeless tobacco: Never  Vaping Use   Vaping status: Never Used  Substance Use Topics  Alcohol use: Yes    Comment: occasionally   Drug use: Yes    Types: Marijuana    Comment: last use a month ago     Allergies   Penicillins   Review of Systems Review of Systems: negative unless otherwise stated in HPI.      Physical Exam Triage Vital Signs ED Triage Vitals  Encounter Vitals Group     BP 02/09/23 0932 (!) 138/96     Systolic BP Percentile --      Diastolic BP Percentile --      Pulse Rate 02/09/23 0842 96     Resp 02/09/23 0932 18     Temp 02/09/23 0932 98.4 F (36.9 C)     Temp Source 02/09/23 0932 Oral     SpO2 02/09/23 0842 99 %     Weight 02/09/23 0930 (!) 320 lb 1.7 oz (145.2 kg)     Height 02/09/23 0930 5\' 8"  (1.727 m)     Head Circumference --      Peak Flow --      Pain Score 02/09/23 0930  0     Pain Loc --      Pain Education --      Exclude from Growth Chart --    No data found.  Updated Vital Signs BP (!) 138/96 (BP Location: Right Arm)   Pulse 79   Temp 98.4 F (36.9 C) (Oral)   Resp 18   Ht 5\' 8"  (1.727 m)   Wt (!) 145.2 kg   SpO2 100%   BMI 48.67 kg/m   Visual Acuity Right Eye Distance:   Left Eye Distance:   Bilateral Distance:    Right Eye Near:   Left Eye Near:    Bilateral Near:     Physical Exam GEN:     alert, non-toxic appearing female in no distress    HENT:  mucus membranes moist, oropharyngeal without lesions or erythema, no nasal discharge,  EYES:   pupils equal and reactive, no scleral injection or discharge NECK:  normal ROM RESP:  no increased work of breathing, faint expiratory wheezing diffusely, no rales or rhonchi CVS:   regular rate and rhythm Skin:   warm and dry, no rash on visible skin    UC Treatments / Results  Labs (all labs ordered are listed, but only abnormal results are displayed) Labs Reviewed - No data to display  EKG   Radiology DG Chest 2 View  Result Date: 02/09/2023 CLINICAL DATA:  Cough EXAM: CHEST - 2 VIEW COMPARISON:  Radiograph 01/20/2023 FINDINGS: The cardiomediastinal silhouette is normal There is no focal consolidation or pulmonary edema. There is no pleural effusion or pneumothorax There is no acute osseous abnormality. Cholecystectomy clips are noted. IMPRESSION: Stable chest with no radiographic evidence of acute cardiopulmonary process. Electronically Signed   By: Lesia Hausen M.D.   On: 02/09/2023 10:32      Procedures Procedures (including critical care time)  Medications Ordered in UC Medications  albuterol (PROVENTIL) (2.5 MG/3ML) 0.083% nebulizer solution 2.5 mg (2.5 mg Nebulization Given 02/09/23 1001)    Initial Impression / Assessment and Plan / UC Course  I have reviewed the triage vital signs and the nursing notes.  Pertinent labs & imaging results that were available during  my care of the patient were reviewed by me and considered in my medical decision making (see chart for details).       Pt is a 47 y.o. female who presents for 6 days  of respiratory symptoms. Mayu is afebrile here. Satting well on room air. Overall pt is non-toxic appearing, well hydrated, without respiratory distress. Pulmonary exam is remarkable for faint expiratory wheezing diffusely.  COVID testing deferred due to duration of symptoms. History consistent with asthma exacerbation likely secondary to recent viral respiratory illness. Discussed symptomatic treatment.  Explained lack of efficacy of antibiotics in viral disease. Treat asthma exacerbation with steroid as below.  Albuterol nebulizer given with some improvement. Typical duration of symptoms discussed. Given tessalon perles, promethazine-DM cough syrup and refilled albuterol inhaler.   Return and ED precautions given and voiced understanding. Discussed MDM, treatment plan and plan for follow-up with patient who agrees with plan.     Final Clinical Impressions(s) / UC Diagnoses   Final diagnoses:  Acute cough  Mild intermittent asthma with acute exacerbation     Discharge Instructions      Your chest-xray did not show bacterial bronchitis or pneumonia. Stop by the pharmacy to pick up your steroids and cough medications.    You can take Tylenol and/or Ibuprofen as needed for fever reduction and pain relief.    For cough: honey 1/2 to 1 teaspoon (you can dilute the honey in water or another fluid).  You can also use guaifenesin and dextromethorphan for cough. You can use a humidifier for chest congestion and cough.  If you don't have a humidifier, you can sit in the bathroom with the hot shower running.      For sore throat: try warm salt water gargles, Mucinex sore throat cough drops or cepacol lozenges, throat spray, warm tea or water with lemon/honey, popsicles or ice, or OTC cold relief medicine for throat discomfort. You  can also purchase chloraseptic spray at the pharmacy or dollar store.   For congestion: take a daily anti-histamine like Zyrtec, Claritin, and a oral decongestant, such as pseudoephedrine.  You can also use Flonase 1-2 sprays in each nostril daily. Afrin is also a good option, if you do not have high blood pressure.    It is important to stay hydrated: drink plenty of fluids (water, gatorade/powerade/pedialyte, juices, or teas) to keep your throat moisturized and help further relieve irritation/discomfort.    Return or go to the Emergency Department if symptoms worsen or do not improve in the next few days      ED Prescriptions     Medication Sig Dispense Auth. Provider   predniSONE (DELTASONE) 50 MG tablet Take 1 tablet (50 mg total) by mouth daily for 5 days. 5 tablet Kylei Purington, DO   promethazine-dextromethorphan (PROMETHAZINE-DM) 6.25-15 MG/5ML syrup Take 5 mLs by mouth 4 (four) times daily as needed. 118 mL Arney Mayabb, DO   albuterol (VENTOLIN HFA) 108 (90 Base) MCG/ACT inhaler Inhale 4 puffs into the lungs every 4 (four) hours as needed for wheezing or shortness of breath. 8 g Teejay Meader, DO   benzonatate (TESSALON) 100 MG capsule Take 1 capsule (100 mg total) by mouth every 8 (eight) hours. 21 capsule Katha Cabal, DO      PDMP not reviewed this encounter.   Katha Cabal, DO 02/14/23 1946

## 2023-02-09 NOTE — ED Triage Notes (Signed)
Pt c/o cough, shortness of breath. Pt had covid 01/20/23. She states her inhaler is not working. Denies fever.

## 2023-03-18 ENCOUNTER — Encounter: Payer: Self-pay | Admitting: Emergency Medicine

## 2023-03-18 ENCOUNTER — Ambulatory Visit
Admission: EM | Admit: 2023-03-18 | Discharge: 2023-03-18 | Disposition: A | Discharge status: Home / Self Care | Payer: BC Managed Care – PPO | Attending: Emergency Medicine | Admitting: Emergency Medicine

## 2023-03-18 DIAGNOSIS — R600 Localized edema: Secondary | ICD-10-CM

## 2023-03-18 MED ORDER — FUROSEMIDE 20 MG PO TABS: 20.0000 mg | ORAL_TABLET | Freq: Every day | ORAL | 0 refills | Status: AC

## 2023-03-18 NOTE — ED Triage Notes (Signed)
Patient c/o bilateral swelling in her lower legs and feet that started 2 weeks ago.

## 2023-03-18 NOTE — Discharge Instructions (Addendum)
Today you were evaluated for your leg swelling  Most likely is just dependent edema and I have a very low suspicion that this is related to your heart or your kidneys that you have no prior conditions that would be contributing  Blood pressure today in office is 140/90, per chart review he typically did not have a high blood pressure advise you to monitor  While leg swelling is present, avoid fast food and limit salt intake as this will worsen your symptoms  Begin use of furosemide every morning for 5 days, this medicine is to help reduce swelling, will cause with, ensure that you are near a restroom, will last about 1 to 2 hours before it starts to ease off  Continue to elevate legs whenever sitting and lying  During the workday take periodic breaks as movement will help reduce swelling as well  Wear compression stockings or hose every as this helps with circulation helps reduce swelling, wear a few hours throughout the day , can wear in  intervals if this makes it more comfortable  You may use Tylenol and or Motrin as needed for pain  You may help warm compresses over the affected area in 10 to 15-minute intervals for comfort  Please schedule follow-up appointment in 2 to 3 weeks with your primary doctor for reevaluation of your symptoms

## 2023-03-18 NOTE — ED Provider Notes (Signed)
[facility]    CSN: [id number] Arrival date & time: 03/18/23  1053      History   Chief Complaint Chief Complaint  Patient presents with   Leg Swelling    HPI Ileanna Gianni is a 47 y.o. female.   Patient presents for evaluation of bilateral lower extremity and ankle swelling beginning 2 weeks ago.  Symptoms started abruptly without precipitating event.  Has occurred before but typically does not last this longer to this extent.  Legs feel tight and are causing pain, making it difficult to bend and to wear shoes.  Has attempted to elevate which has been ineffective, has resolved symptoms in the past.  Denies dietary changes and endorses she has not been eating frequent or large quantities of fast food or high salt intake.  Denies activity change.  Denies changes in medications.  Denies presence of numbness or tingling.  Denies cardiac or nephrotic history.  Is established with primary care.    Past Medical History:  Diagnosis Date   Anemia    Asthma    Obese     Patient Active Problem List   Diagnosis Date Noted   Strep throat 12/30/2022    Past Surgical History:  Procedure Laterality Date   CESAREAN SECTION     CHOLECYSTECTOMY      OB History   No obstetric history on file.      Home Medications    Prior to Admission medications   Medication Sig Start Date End Date Taking? Authorizing Provider  albuterol (VENTOLIN HFA) 108 (90 Base) MCG/ACT inhaler Inhale 4 puffs into the lungs every 4 (four) hours as needed for wheezing or shortness of breath. 02/09/23   Brimage, Vondra, DO  Ascorbic Acid (VITAMIN C) 500 MG CAPS Take by mouth.    [provider]  aspirin EC 81 MG tablet Take 81 mg by mouth daily.    [provider]  benzonatate (TESSALON) 100 MG capsule Take 1 capsule (100 mg total) by mouth every 8 (eight) hours. 02/09/23   Katha Cabal, DO  ferrous sulfate 325 (65 FE) MG tablet Take 325 mg by mouth daily with breakfast.     [provider]  fluticasone (FLONASE) 50 MCG/ACT nasal spray Place 2 sprays into both nostrils daily. 04/07/22   Domenick Gong, MD  Multiple Vitamins-Minerals (MULTIVITAMIN ADULT PO) Take 1 tablet by mouth.    [provider]  Omega-3 Fatty Acids (FISH OIL) 1000 MG CAPS Take by mouth.    [provider]  promethazine-dextromethorphan (PROMETHAZINE-DM) 6.25-15 MG/5ML syrup Take 5 mLs by mouth 4 (four) times daily as needed. 02/09/23   Katha Cabal, DO  Spacer/Aero-Holding Chambers (AEROCHAMBER MV) inhaler Use as instructed 04/07/22   Domenick Gong, MD    Family History Family History  Problem Relation Age of Onset   Cancer Mother    Hypertension Mother    Renal cancer Father    Brain cancer Father     Social History Social History   Tobacco Use   Smoking status: Never   Smokeless tobacco: Never  Vaping Use   Vaping status: Never Used  Substance Use Topics   Alcohol use: Yes    Comment: occasionally   Drug use: Yes    Types: Marijuana    Comment: last use a month ago     Allergies   Penicillins   Review of Systems Review of Systems   Physical Exam Triage Vital Signs ED Triage Vitals  Encounter Vitals Group  BP 03/18/23 1146 (!) 140/90     Systolic BP Percentile --      Diastolic BP Percentile --      Pulse Rate 03/18/23 1146 85     Resp 03/18/23 1146 15     Temp 03/18/23 1146 98.8 F (37.1 C)     Temp Source 03/18/23 1146 Oral     SpO2 03/18/23 1146 98 %     Weight 03/18/23 1145 (!) 320 lb 1.7 oz (145.2 kg)     Height 03/18/23 1145 5\' 8"  (1.727 m)     Head Circumference --      Peak Flow --      Pain Score 03/18/23 1145 4     Pain Loc --      Pain Education --      Exclude from Growth Chart --    No data found.  Updated Vital Signs BP (!) 140/90 (BP Location: Left Arm)   Pulse 85   Temp 98.8 F (37.1 C) (Oral)   Resp 15   Ht 5\' 8"  (1.727 m)   Wt (!) 320 lb 1.7 oz (145.2 kg)   SpO2 98%   BMI 48.67 kg/m    Visual Acuity Right Eye Distance:   Left Eye Distance:   Bilateral Distance:    Right Eye Near:   Left Eye Near:    Bilateral Near:     Physical Exam Constitutional:      Appearance: Normal appearance.  Eyes:     Extraocular Movements: Extraocular movements intact.  Cardiovascular:     Comments: Bilateral nonpitting lower extremity edema, left worse than right, 2+ popliteal and dorsalis pedis pulses, able to bear weight and complete all range of motion to the lower extremity and ankle, no erythema or warmth to the skin noted Pulmonary:     Effort: Pulmonary effort is normal.  Neurological:     Mental Status: She is alert and oriented to person, place, and time. Mental status is at baseline.      UC Treatments / Results  Labs (all labs ordered are listed, but only abnormal results are displayed) Labs Reviewed - No data to display  EKG   Radiology No results found.  Procedures Procedures (including critical care time)  Medications Ordered in UC Medications - No data to display  Initial Impression / Assessment and Plan / UC Course  I have reviewed the triage vital signs and the nursing notes.  Pertinent labs & imaging results that were available during my care of the patient were reviewed by me and considered in my medical decision making (see chart for details).  Bilateral lower extremity edema  Blood pressure 140/90, stable, at baseline, no prior history of hypertension or further cardiac disease, no history of kidney or liver disease, low suspicion for thrombosis symptoms occurring bilaterally, discussed this with patient advised to monitor blood pressure at home, most likely dependent edema and will move forward with supportive treatment, prescribed furosemide daily for 5 days, recommended use of heat, compression, elevation, over-the-counter analgesics with increased activity to help with circulation and blood flow, advise follow-up with PCP in 2 to 3 weeks for  reevaluation of symptoms Final Clinical Impressions(s) / UC Diagnoses   Final diagnoses:  None   Discharge Instructions   None    ED Prescriptions   None    PDMP not reviewed this encounter.   Valinda Hoar, NP 03/18/23 1300

## 2023-05-04 ENCOUNTER — Encounter: Payer: Self-pay | Admitting: Emergency Medicine

## 2023-05-04 ENCOUNTER — Ambulatory Visit
Admission: EM | Admit: 2023-05-04 | Discharge: 2023-05-04 | Disposition: A | Discharge status: Home / Self Care | Payer: BC Managed Care – PPO | Attending: Family Medicine | Admitting: Family Medicine

## 2023-05-04 DIAGNOSIS — J029 Acute pharyngitis, unspecified: Secondary | ICD-10-CM | POA: Diagnosis present

## 2023-05-04 DIAGNOSIS — N760 Acute vaginitis: Secondary | ICD-10-CM | POA: Insufficient documentation

## 2023-05-04 DIAGNOSIS — Z90710 Acquired absence of both cervix and uterus: Secondary | ICD-10-CM | POA: Diagnosis not present

## 2023-05-04 DIAGNOSIS — R030 Elevated blood-pressure reading, without diagnosis of hypertension: Secondary | ICD-10-CM | POA: Diagnosis not present

## 2023-05-04 DIAGNOSIS — I10 Essential (primary) hypertension: Secondary | ICD-10-CM

## 2023-05-04 DIAGNOSIS — J020 Streptococcal pharyngitis: Secondary | ICD-10-CM | POA: Diagnosis not present

## 2023-05-04 DIAGNOSIS — B9689 Other specified bacterial agents as the cause of diseases classified elsewhere: Secondary | ICD-10-CM | POA: Insufficient documentation

## 2023-05-04 DIAGNOSIS — Z1152 Encounter for screening for COVID-19: Secondary | ICD-10-CM | POA: Insufficient documentation

## 2023-05-04 LAB — WET PREP, GENITAL
Sperm: NONE SEEN
Trich, Wet Prep: NONE SEEN
WBC, Wet Prep HPF POC: NONE SEEN — AB (ref <10)
Yeast Wet Prep HPF POC: NONE SEEN

## 2023-05-04 LAB — SARS CORONAVIRUS 2 BY RT PCR: SARS Coronavirus 2 by RT PCR: NEGATIVE

## 2023-05-04 LAB — GROUP A STREP BY PCR: Group A Strep by PCR: DETECTED — AB

## 2023-05-04 MED ORDER — AZITHROMYCIN 250 MG PO TABS: ORAL_TABLET | ORAL | 0 refills | Status: DC

## 2023-05-04 MED ORDER — METRONIDAZOLE 500 MG PO TABS: 500.0000 mg | ORAL_TABLET | Freq: Two times a day (BID) | ORAL | 0 refills | Status: DC

## 2023-05-04 NOTE — Discharge Instructions (Signed)
Your strep is positive but you are of COVID negative.  Stop by the pharmacy to pick up your prescriptions.  Follow up with your primary care provider as needed.

## 2023-05-04 NOTE — ED Provider Notes (Addendum)
[facility]    CSN: [id number] Arrival date & time: 05/04/23  0851      History   Chief Complaint Chief Complaint  Jessica Nguyen presents with   Sore Throat   Cough    HPI Jessica Nguyen is a 47 y.o. female.   HPI  History obtained from the Jessica Nguyen. Jessica Nguyen presents for sore throat that started 2 days ago.  Jessica Nguyen took Theraflu and gargled with salt water.  Jessica Nguyen woke up with this morning with painful swallowing. No fever, rhinorrhea, ear pain, new rash or abdominal pain. Has slight cough and nasal congestion. Some of Jessica Nguyen kids are sick.       Past Medical History:  Diagnosis Date   Anemia    Asthma    Obese     Jessica Nguyen Active Problem List   Diagnosis Date Noted   Strep throat 12/30/2022    Past Surgical History:  Procedure Laterality Date   CESAREAN SECTION     CHOLECYSTECTOMY      OB History   No obstetric history on file.      Home Medications    Prior to Admission medications   Medication Sig Start Date End Date Taking? Authorizing Provider  azithromycin (ZITHROMAX Z-PAK) 250 MG tablet Take 2 tablets on day 1 then 1 tablet daily 05/04/23  Yes Ramie Palladino, DO  hydrochlorothiazide (HYDRODIURIL) 12.5 MG tablet Take by mouth. 04/16/23 04/15/24 Yes [provider]  metroNIDAZOLE (FLAGYL) 500 MG tablet Take 1 tablet (500 mg total) by mouth 2 (two) times daily. 05/04/23  Yes Aurorah Schlachter, DO  albuterol (VENTOLIN HFA) 108 (90 Base) MCG/ACT inhaler Inhale 4 puffs into the lungs every 4 (four) hours as needed for wheezing or shortness of breath. 02/09/23   Marshawn Ninneman, DO  Ascorbic Acid (VITAMIN C) 500 MG CAPS Take by mouth.    [provider]  aspirin EC 81 MG tablet Take 81 mg by mouth daily.    [provider]  benzonatate (TESSALON) 100 MG capsule Take 1 capsule (100 mg total) by mouth every 8 (eight) hours. 02/09/23   Katha Cabal, DO  ferrous sulfate 325 (65 FE) MG tablet Take 325 mg by mouth daily with breakfast.     [provider]  fluticasone (FLONASE) 50 MCG/ACT nasal spray Place 2 sprays into both nostrils daily. 04/07/22   Domenick Gong, MD  furosemide (LASIX) 20 MG tablet Take 1 tablet (20 mg total) by mouth daily for 5 days. 03/18/23 03/23/23  Valinda Hoar, NP  Multiple Vitamins-Minerals (MULTIVITAMIN ADULT PO) Take 1 tablet by mouth.    [provider]  Omega-3 Fatty Acids (FISH OIL) 1000 MG CAPS Take by mouth.    [provider]  promethazine-dextromethorphan (PROMETHAZINE-DM) 6.25-15 MG/5ML syrup Take 5 mLs by mouth 4 (four) times daily as needed. 02/09/23   Katha Cabal, DO  Spacer/Aero-Holding Chambers (AEROCHAMBER MV) inhaler Use as instructed 04/07/22   Domenick Gong, MD    Family History Family History  Problem Relation Age of Onset   Cancer Mother    Hypertension Mother    Renal cancer Father    Brain cancer Father     Social History Social History   Tobacco Use   Smoking status: Never   Smokeless tobacco: Never  Vaping Use   Vaping status: Never Used  Substance Use Topics   Alcohol use: Yes    Comment: occasionally   Drug use: Yes    Types: Marijuana    Comment: last use a month  ago     Allergies   Penicillins   Review of Systems Review of Systems: negative unless otherwise stated in HPI.      Physical Exam Triage Vital Signs ED Triage Vitals  Encounter Vitals Group     BP 05/04/23 0941 (!) 154/98     Systolic BP Percentile --      Diastolic BP Percentile --      Pulse Rate 05/04/23 0941 79     Resp 05/04/23 0941 14     Temp 05/04/23 0941 98.1 F (36.7 C)     Temp Source 05/04/23 0941 Oral     SpO2 05/04/23 0941 97 %     Weight 05/04/23 0940 (!) 320 lb 1.7 oz (145.2 kg)     Height 05/04/23 0940 5\' 8"  (1.727 m)     Head Circumference --      Peak Flow --      Pain Score 05/04/23 0940 7     Pain Loc --      Pain Education --      Exclude from Growth Chart --    No data found.  Updated Vital Signs BP (!) 154/98  (BP Location: Left Arm)   Pulse 79   Temp 98.1 F (36.7 C) (Oral)   Resp 14   Ht 5\' 8"  (1.727 m)   Wt (!) 145.2 kg   SpO2 97%   BMI 48.67 kg/m   Visual Acuity Right Eye Distance:   Left Eye Distance:   Bilateral Distance:    Right Eye Near:   Left Eye Near:    Bilateral Near:     Physical Exam GEN:     alert, non-toxic appearing female in no distress    HENT:  mucus membranes moist, oropharyngeal with erythema, + tonsillar hypertrophy but no exudates, clear nasal discharge EYES:   pupils equal and reactive, no scleral injection or discharge NECK:  normal ROM, + lymphadenopathy, no meningismus   RESP:  no increased work of breathing Skin:   warm and dry   UC Treatments / Results  Labs (all labs ordered are listed, but only abnormal results are displayed) Labs Reviewed  GROUP A STREP BY PCR - Abnormal; Notable for the following components:      Result Value   Group A Strep by PCR DETECTED (*)    All other components within normal limits  WET PREP, GENITAL - Abnormal; Notable for the following components:   Clue Cells Wet Prep HPF POC PRESENT (*)    WBC, Wet Prep HPF POC NONE SEEN (*)    All other components within normal limits  SARS CORONAVIRUS 2 BY RT PCR    EKG   Radiology No results found.  Procedures Procedures (including critical care time)  Medications Ordered in UC Medications - No data to display  Initial Impression / Assessment and Plan / UC Course  I have reviewed the triage vital signs and the nursing notes.  Pertinent labs & imaging results that were available during my care of the Jessica Nguyen were reviewed by me and considered in my medical decision making (see chart for details).       Strep pharyngitis Jessica Nguyen is a 47 y.o. female who presents for sore throat for the past 2 days.  On exam, pharyngeal exam is \\erythematous  but no exudates.  Jessica Nguyen does have some tonsillar hypertrophy. Strep test is positive. Overall Jessica Nguyen is  non-toxic-appearing, well-hydrated and without respiratory distress. Jessica Nguyen is afebrile here.  Tylenol/Motrin as needed for  discomfort.  Continue gargling with warm salt water.  Recommended to avoid anything that irritates herthroat.  Treat with azithromycin as below as Jessica Nguyen has allergy to PCN's. Stressed the importance of hydration.  Work note provided, as needed.   Has increased vaginal discharge with fishy odor. No dysuria. Had slight vaginal bleeding about a week ago.  No LMP recorded. Jessica Nguyen is Jessica Nguyen. Had hysterectomy.  Wet prep concerning for BV but no yeast or trichomonas seen.  Jessica Nguyen is not interested in STD testing at this time.  Treat with metronidazole twice daily for 7 days.  Advised not to drink any alcohol while taking this medication.  Harshini is hypertensive here.  BP 154/93. I suspect high blood pressure issue is secondary to pain. BP at primary care visit was 139/91 on 04/16/23 and Jessica Nguyen was started on hydrochlorothiazide.  Recommended Jessica Nguyen check herblood pressure and follow up with Jessica Nguyen primary care provider in the next 2 weeks.    Discussed MDM, treatment plan and plan for follow-up with Jessica Nguyen who agrees with plan.      Final Clinical Impressions(s) / UC Diagnoses   Final diagnoses:  Strep pharyngitis  BV (bacterial vaginosis)  Elevated blood pressure reading with diagnosis of hypertension     Discharge Instructions      Your strep is positive but you are of COVID negative.  Stop by the pharmacy to pick up your prescriptions.  Follow up with your primary care provider as needed.      ED Prescriptions     Medication Sig Dispense Auth. Provider   azithromycin (ZITHROMAX Z-PAK) 250 MG tablet Take 2 tablets on day 1 then 1 tablet daily 6 tablet Travor Royce, DO   metroNIDAZOLE (FLAGYL) 500 MG tablet Take 1 tablet (500 mg total) by mouth 2 (two) times daily. 14 tablet Yerik Zeringue, Seward Meth, DO      PDMP not reviewed this encounter.   Katha Cabal, DO 05/04/23  1115    Katha Cabal, DO 05/04/23 1147

## 2023-05-04 NOTE — ED Triage Notes (Signed)
Patient c/o sore throat, slight cough and congestion that started 2 days ago.  Patient unsure of fevers.

## 2023-05-07 NOTE — Plan of Care (Signed)
[facility] Tonsillectomy/Adenoidectomy, Postoperative PEDS care plan entered in error.

## 2023-07-22 ENCOUNTER — Ambulatory Visit (INDEPENDENT_AMBULATORY_CARE_PROVIDER_SITE_OTHER): Payer: BC Managed Care – PPO

## 2023-07-22 ENCOUNTER — Ambulatory Visit
Admission: RE | Admit: 2023-07-22 | Discharge: 2023-07-22 | Disposition: A | Discharge status: Home / Self Care | Payer: BC Managed Care – PPO | Source: Ambulatory Visit | Attending: Physician Assistant | Admitting: Physician Assistant

## 2023-07-22 VITALS — BP 151/98 | HR 96 | Temp 98.7°F | Resp 15 | Ht 68.0 in | Wt 320.1 lb

## 2023-07-22 DIAGNOSIS — J45901 Unspecified asthma with (acute) exacerbation: Secondary | ICD-10-CM | POA: Diagnosis not present

## 2023-07-22 DIAGNOSIS — R0602 Shortness of breath: Secondary | ICD-10-CM

## 2023-07-22 DIAGNOSIS — B349 Viral infection, unspecified: Secondary | ICD-10-CM | POA: Diagnosis not present

## 2023-07-22 LAB — SARS CORONAVIRUS 2 BY RT PCR: SARS Coronavirus 2 by RT PCR: NEGATIVE

## 2023-07-22 MED ORDER — PROMETHAZINE-DM 6.25-15 MG/5ML PO SYRP: 5.0000 mL | ORAL_SOLUTION | Freq: Four times a day (QID) | ORAL | 0 refills | Status: DC | PRN

## 2023-07-22 MED ORDER — ALBUTEROL SULFATE HFA 108 (90 BASE) MCG/ACT IN AERS: 1.0000 | INHALATION_SPRAY | Freq: Four times a day (QID) | RESPIRATORY_TRACT | 0 refills | Status: AC | PRN

## 2023-07-22 MED ORDER — PREDNISONE 10 MG PO TABS: ORAL_TABLET | ORAL | 0 refills | Status: DC

## 2023-07-22 NOTE — Discharge Instructions (Addendum)
-  Negative COVID.  I did not see pneumonia on your chest x-ray but I will contact you if the radiologist does. - Will send antibiotics if the radiologist says you have pneumonia. - Symptoms likely viral in your asthma is flared up. - I sent cough medicine and prednisone  I also refilled your inhaler. - Increase rest and fluids.  Viral illnesses can last for a couple of weeks sometimes especially if you have a chest cold. - If you do have another fever, worsening cough or increased breathing problem please return for reevaluation.

## 2023-07-22 NOTE — ED Provider Notes (Signed)
 [facility]    CSN: [id number] Arrival date & time: 07/22/23  1034      History   Chief Complaint Chief Complaint  Patient presents with   Cough    Appointment    HPI Jessica Nguyen is a 48 y.o. female presenting for 3-day history of cough, fatigue, body aches, and congestion.  Patient says her cough is nonproductive.  Fever at onset of 201 degrees.  Temp today is normal without any antipyretics.  She denies sore throat, chest pain.  No vomiting or diarrhea.  Does have history of asthma.  She does think she is having more breathing difficulty than normal.  Has been using her albuterol  inhaler and taking Mucinex.  She is worried about pneumonia.  No other complaints.  HPI  Past Medical History:  Diagnosis Date   Anemia    Asthma    Obese     Patient Active Problem List   Diagnosis Date Noted   Strep throat 12/30/2022    Past Surgical History:  Procedure Laterality Date   CESAREAN SECTION     CHOLECYSTECTOMY      OB History   No obstetric history on file.      Home Medications    Prior to Admission medications   Medication Sig Start Date End Date Taking? Authorizing Provider  albuterol  (VENTOLIN  HFA) 108 (90 Base) MCG/ACT inhaler Inhale 1-2 puffs into the lungs every 6 (six) hours as needed for wheezing or shortness of breath. 07/22/23  Yes Arvis Huxley B, PA-C  predniSONE  (DELTASONE ) 10 MG tablet Take 6 tabs p.o. on day 1 and decrease by 1 tablet daily until complete 07/22/23  Yes Arvis Huxley B, PA-C  promethazine -dextromethorphan (PROMETHAZINE -DM) 6.25-15 MG/5ML syrup Take 5 mLs by mouth 4 (four) times daily as needed. 07/22/23  Yes Arvis Huxley NOVAK, PA-C  Ascorbic Acid (VITAMIN C) 500 MG CAPS Take by mouth.    [provider]  aspirin EC 81 MG tablet Take 81 mg by mouth daily.    [provider]  ferrous sulfate 325 (65 FE) MG tablet Take 325 mg by mouth daily with breakfast.    [provider]  fluticasone  (FLONASE ) 50  MCG/ACT nasal spray Place 2 sprays into both nostrils daily. 04/07/22   Van Knee, MD  furosemide  (LASIX ) 20 MG tablet Take 1 tablet (20 mg total) by mouth daily for 5 days. 03/18/23 03/23/23  Teresa Shelba SAUNDERS, NP  hydrochlorothiazide (HYDRODIURIL) 12.5 MG tablet Take by mouth. 04/16/23 04/15/24  [provider]  Multiple Vitamins-Minerals (MULTIVITAMIN ADULT PO) Take 1 tablet by mouth.    [provider]  Omega-3 Fatty Acids (FISH OIL) 1000 MG CAPS Take by mouth.    [provider]  Spacer/Aero-Holding Chambers (AEROCHAMBER MV) inhaler Use as instructed 04/07/22   Van Knee, MD    Family History Family History  Problem Relation Age of Onset   Cancer Mother    Hypertension Mother    Renal cancer Father    Brain cancer Father     Social History Social History   Tobacco Use   Smoking status: Never   Smokeless tobacco: Never  Vaping Use   Vaping status: Never Used  Substance Use Topics   Alcohol use: Yes    Comment: occasionally   Drug use: Yes    Types: Marijuana    Comment: last use a month ago     Allergies   Penicillins   Review of Systems Review of Systems  Constitutional:  Positive  for fever. Negative for chills, diaphoresis and fatigue.  HENT:  Positive for congestion and rhinorrhea. Negative for ear pain, sinus pressure, sinus pain and sore throat.   Respiratory:  Positive for cough, shortness of breath and wheezing.   Gastrointestinal:  Negative for abdominal pain, nausea and vomiting.  Musculoskeletal:  Positive for myalgias.  Skin:  Negative for rash.  Neurological:  Negative for weakness and headaches.  Hematological:  Negative for adenopathy.     Physical Exam Triage Vital Signs ED Triage Vitals  Encounter Vitals Group     BP 07/22/23 1105 (!) 151/98     Systolic BP Percentile --      Diastolic BP Percentile --      Pulse Rate 07/22/23 1105 96     Resp 07/22/23 1105 15     Temp 07/22/23 1105 98.7 F (37.1 C)      Temp Source 07/22/23 1105 Oral     SpO2 07/22/23 1105 100 %     Weight 07/22/23 1104 (!) 320 lb 1.7 oz (145.2 kg)     Height 07/22/23 1104 5' 8 (1.727 m)     Head Circumference --      Peak Flow --      Pain Score 07/22/23 1104 0     Pain Loc --      Pain Education --      Exclude from Growth Chart --    No data found.  Updated Vital Signs BP (!) 151/98 (BP Location: Left Arm)   Pulse 96   Temp 98.7 F (37.1 C) (Oral)   Resp 15   Ht 5' 8 (1.727 m)   Wt (!) 320 lb 1.7 oz (145.2 kg)   LMP 07/02/2023 (Approximate)   SpO2 100%   BMI 48.67 kg/m    Physical Exam Vitals and nursing note reviewed.  Constitutional:      General: She is not in acute distress.    Appearance: Normal appearance. She is ill-appearing. She is not toxic-appearing.  HENT:     Head: Normocephalic and atraumatic.     Nose: Congestion present.     Mouth/Throat:     Mouth: Mucous membranes are moist.     Pharynx: Oropharynx is clear.  Eyes:     General: No scleral icterus.       Right eye: No discharge.        Left eye: No discharge.     Conjunctiva/sclera: Conjunctivae normal.  Cardiovascular:     Rate and Rhythm: Normal rate and regular rhythm.     Heart sounds: Normal heart sounds.  Pulmonary:     Effort: Pulmonary effort is normal. No respiratory distress.     Breath sounds: Wheezing present.  Musculoskeletal:     Cervical back: Neck supple.  Skin:    General: Skin is dry.  Neurological:     General: No focal deficit present.     Mental Status: She is alert. Mental status is at baseline.     Motor: No weakness.     Gait: Gait normal.  Psychiatric:        Mood and Affect: Mood normal.        Behavior: Behavior normal.      UC Treatments / Results  Labs (all labs ordered are listed, but only abnormal results are displayed) Labs Reviewed  SARS CORONAVIRUS 2 BY RT PCR    EKG   Radiology DG Chest 2 View Result Date: 07/22/2023 CLINICAL DATA:  Cough and shortness of breath for  3 days. EXAM: CHEST - 2 VIEW COMPARISON:  Radiographs 01/20/2023 and 09/18/2021. FINDINGS: The heart size and mediastinal contours are normal. The lungs are clear. There is no pleural effusion or pneumothorax. No acute osseous findings are identified. IMPRESSION: No evidence of acute cardiopulmonary process. Electronically Signed   By: Elsie Perone M.D.   On: 07/22/2023 11:42    Procedures Procedures (including critical care time)  Medications Ordered in UC Medications - No data to display  Initial Impression / Assessment and Plan / UC Course  I have reviewed the triage vital signs and the nursing notes.  Pertinent labs & imaging results that were available during my care of the patient were reviewed by me and considered in my medical decision making (see chart for details).   48 year old female with history of asthma presenting for 3-day history of cough and congestion.  Also reporting fatigue, shortness of breath, fever at onset.  Using albuterol  inhaler and Mucinex.  Vital stable.  She is afebrile.  She is mildly ill-appearing but nontoxic.  She does have somewhat decreased breath sounds in all lung fields and few scattered wheezes.   COVID test obtained.  Negative.  Reviewed result with patient.  Chest x-ray obtained and normal as read by me.  Compared to chest x-ray today to 1 from July 2024.  Advised patient if radiologist sees pneumonia I will send antibiotics, otherwise she does not need them as her symptoms are likely due to a virus.  Viral illness.  Asthma exacerbation.  Sent Promethazine  DM, prednisone  taper and refilled albuterol  inhaler.  Encouraged increasing rest and fluids.  Reviewed return and ER precautions.  Work note was provided.  1 acute illness with systemic symptoms.  Flareup of chronic underlying condition.  Chest x-ray over read is negative for pneumonia.  Final Clinical Impressions(s) / UC Diagnoses   Final diagnoses:  Shortness of breath  Viral illness   Asthma with acute exacerbation, unspecified asthma severity, unspecified whether persistent     Discharge Instructions      -Negative COVID.  I did not see pneumonia on your chest x-ray but I will contact you if the radiologist does. - Will send antibiotics if the radiologist says you have pneumonia. - Symptoms likely viral in your asthma is flared up. - I sent cough medicine and prednisone  I also refilled your inhaler. - Increase rest and fluids.  Viral illnesses can last for a couple of weeks sometimes especially if you have a chest cold. - If you do have another fever, worsening cough or increased breathing problem please return for reevaluation.     ED Prescriptions     Medication Sig Dispense Auth. Provider   albuterol  (VENTOLIN  HFA) 108 (90 Base) MCG/ACT inhaler Inhale 1-2 puffs into the lungs every 6 (six) hours as needed for wheezing or shortness of breath. 1 g Arvis Huxley B, PA-C   predniSONE  (DELTASONE ) 10 MG tablet Take 6 tabs p.o. on day 1 and decrease by 1 tablet daily until complete 21 tablet Arvis Huxley B, PA-C   promethazine -dextromethorphan (PROMETHAZINE -DM) 6.25-15 MG/5ML syrup Take 5 mLs by mouth 4 (four) times daily as needed. 118 mL Arvis Huxley NOVAK, PA-C      PDMP not reviewed this encounter.   Arvis Huxley NOVAK, PA-C 07/22/23 1336

## 2023-07-22 NOTE — ED Triage Notes (Signed)
Patient c/o cough and chest congestion for the past 3 days.  Patient denies fevers.  

## 2023-12-17 ENCOUNTER — Ambulatory Visit
Admission: EM | Admit: 2023-12-17 | Discharge: 2023-12-17 | Disposition: A | Discharge status: Home / Self Care | Attending: Emergency Medicine | Admitting: Emergency Medicine

## 2023-12-17 ENCOUNTER — Ambulatory Visit (INDEPENDENT_AMBULATORY_CARE_PROVIDER_SITE_OTHER)

## 2023-12-17 ENCOUNTER — Ambulatory Visit: Payer: Self-pay | Admitting: Emergency Medicine

## 2023-12-17 DIAGNOSIS — J189 Pneumonia, unspecified organism: Secondary | ICD-10-CM | POA: Diagnosis present

## 2023-12-17 DIAGNOSIS — R509 Fever, unspecified: Secondary | ICD-10-CM

## 2023-12-17 LAB — URINALYSIS, W/ REFLEX TO CULTURE (INFECTION SUSPECTED)
Bilirubin Urine: NEGATIVE
Glucose, UA: NEGATIVE mg/dL
Ketones, ur: 40 mg/dL — AB
Leukocytes,Ua: NEGATIVE
Nitrite: NEGATIVE
Protein, ur: 30 mg/dL — AB
Specific Gravity, Urine: 1.02 (ref 1.005–1.030)
pH: 5.5 (ref 5.0–8.0)

## 2023-12-17 LAB — RESP PANEL BY RT-PCR (FLU A&B, COVID) ARPGX2
Influenza A by PCR: NEGATIVE
Influenza B by PCR: NEGATIVE
SARS Coronavirus 2 by RT PCR: NEGATIVE

## 2023-12-17 MED ORDER — IBUPROFEN 800 MG PO TABS
800.0000 mg | ORAL_TABLET | Freq: Once | ORAL | Status: AC
  Administered 2023-12-17: 800 mg via ORAL

## 2023-12-17 MED ORDER — ALBUTEROL SULFATE HFA 108 (90 BASE) MCG/ACT IN AERS: 2.0000 | INHALATION_SPRAY | RESPIRATORY_TRACT | 0 refills | Status: AC | PRN

## 2023-12-17 MED ORDER — DOXYCYCLINE HYCLATE 100 MG PO CAPS: 100.0000 mg | ORAL_CAPSULE | Freq: Two times a day (BID) | ORAL | 0 refills | Status: AC

## 2023-12-17 MED ORDER — IBUPROFEN 600 MG PO TABS: 600.0000 mg | ORAL_TABLET | Freq: Three times a day (TID) | ORAL | 0 refills | Status: DC | PRN

## 2023-12-17 NOTE — Discharge Instructions (Signed)
 I am concerned that you have pneumonia.  COVID flu negative, UA negative for UTI.   Take two puffs from your albuterol  inhaler with your spacer every 4-6 hours as needed for cough. Finish the antibiotics, even if you feel better. Take tylenol 1 gram combined with  600 mg of motrin up to 3-4 times a day as needed for pain. Make sure you drink extra fluids. Return to the ER if you get worse, have a fever >100.4, or any other concerns.   If the spacer is too expensive at the pharmacy, you can get an AeroChamber Z-Stat off of [employer] for about $10-$15.  Go to www.goodrx.com  or www.costplusdrugs.com to look up your medications. This will give you a list of where you can find your prescriptions at the most affordable prices. Or ask the pharmacist what the cash price is, or if they have any other discount programs available to help make your medication more affordable. This can be less expensive than what you would pay with insurance.

## 2023-12-17 NOTE — ED Triage Notes (Signed)
 Pt c/o bodyaches x3 days & fever/chills since this AM. Tmax 102 today. Has tried tylenol last dose around 0700.

## 2023-12-17 NOTE — ED Provider Notes (Signed)
 HPI  SUBJECTIVE:  Jessica Nguyen is a 48 y.o. female who presents with bodyaches, chills starting 3 days ago.  She reports fevers Tmax 103 and worsening of her body aches starting this morning.  She reports headache, "light" cough, nausea and anorexia.  No nasal congestion, rhinorrhea, sore throat, wheezing, shortness of breath, vomiting, diarrhea, abdominal pain, urinary complaints.  No tick bite in the past 6 weeks.  No rash, neck stiffness, photophobia.  No known COVID or flu exposure.  She did not get the COVID or flu vaccines.  She tried Tylenol with some improvement in her symptoms.  Last dose was within 6 hours of evaluation.  Symptoms worse with movement.  She has a past medical history of asthma, BMI above 30 and is status post cholecystectomy.  LMP: 5 months ago.  Denies the possibility of being pregnant.  PCP: [facility].  Past Medical History:  Diagnosis Date   Anemia    Asthma    Obese     Past Surgical History:  Procedure Laterality Date   CESAREAN SECTION     CHOLECYSTECTOMY      Family History  Problem Relation Age of Onset   Cancer Mother    Hypertension Mother    Renal cancer Father    Brain cancer Father     Social History   Tobacco Use   Smoking status: Never   Smokeless tobacco: Never  Vaping Use   Vaping status: Never Used  Substance Use Topics   Alcohol use: Yes    Comment: occasionally   Drug use: Yes    Types: Marijuana    Comment: last use a month ago    No current facility-administered medications for this encounter.  Current Outpatient Medications:    albuterol  (VENTOLIN  HFA) 108 (90 Base) MCG/ACT inhaler, Inhale 1-2 puffs into the lungs every 6 (six) hours as needed for wheezing or shortness of breath., Disp: 1 g, Rfl: 0   albuterol  (VENTOLIN  HFA) 108 (90 Base) MCG/ACT inhaler, Inhale 2 puffs into the lungs every 4 (four) hours as needed for wheezing or shortness of breath. Dispense with aerochamber, Disp: 1 each, Rfl: 0   Ascorbic  Acid (VITAMIN C) 500 MG CAPS, Take by mouth., Disp: , Rfl:    aspirin EC 81 MG tablet, Take 81 mg by mouth daily., Disp: , Rfl:    doxycycline (VIBRAMYCIN) 100 MG capsule, Take 1 capsule (100 mg total) by mouth 2 (two) times daily for 5 days., Disp: 10 capsule, Rfl: 0   ferrous sulfate 325 (65 FE) MG tablet, Take 325 mg by mouth daily with breakfast., Disp: , Rfl:    hydrochlorothiazide (HYDRODIURIL) 12.5 MG tablet, Take by mouth., Disp: , Rfl:    ibuprofen (ADVIL) 600 MG tablet, Take 1 tablet (600 mg total) by mouth every 8 (eight) hours as needed., Disp: 30 tablet, Rfl: 0   Multiple Vitamins-Minerals (MULTIVITAMIN ADULT PO), Take 1 tablet by mouth., Disp: , Rfl:    Omega-3 Fatty Acids (FISH OIL) 1000 MG CAPS, Take by mouth., Disp: , Rfl:    Spacer/Aero-Holding Chambers (AEROCHAMBER MV) inhaler, Use as instructed, Disp: 1 each, Rfl: 1   furosemide  (LASIX ) 20 MG tablet, Take 1 tablet (20 mg total) by mouth daily for 5 days., Disp: 5 tablet, Rfl: 0  Allergies  Allergen Reactions   Penicillins Swelling     ROS  As noted in HPI.   Physical Exam  BP (!) 142/84 (BP Location: Left Arm)   Pulse (!) 120  Temp (!) 103 F (39.4 C) (Oral)   Resp 16   Ht 5\' 8"  (1.727 m)   Wt (!) 154.2 kg   SpO2 99%   BMI 51.70 kg/m   Constitutional: Well developed, well nourished, no acute distress Eyes: PERRL, EOMI, conjunctiva normal bilaterally HENT: Normocephalic, atraumatic,mucus membranes moist.  No nasal congestion.  No maxillary, frontal sinus tenderness. Neck: No cervical lymphadenopathy Respiratory: Clear to auscultation bilaterally, no rales, no wheezing, no rhonchi Cardiovascular: Regular tachycardia, no murmurs, no gallops, no rubs GI: Soft, nondistended, normal bowel sounds, nontender, no rebound, no guarding Back: no CVAT skin: No rash, skin intact Musculoskeletal: No edema, no tenderness, no deformities Neurologic: Alert & oriented x 3, CN III-XII grossly intact, no motor deficits,  sensation grossly intact Psychiatric: Speech and behavior appropriate   ED Course   Medications  ibuprofen (ADVIL) tablet 800 mg (800 mg Oral Given 12/17/23 1243)    Orders Placed This Encounter  Procedures   Resp Panel by RT-PCR (Flu A&B, Covid) Anterior Nasal Swab    Standing Status:   Standing    Number of Occurrences:   1   DG Chest 2 View    Standing Status:   Standing    Number of Occurrences:   1    Reason for Exam (SYMPTOM  OR DIAGNOSIS REQUIRED):   Fever, rule out pneumonia   Urinalysis, w/ Reflex to Culture (Infection Suspected) -Urine, Clean Catch    Standing Status:   Standing    Number of Occurrences:   1    Specimen Source:   Urine, Clean Catch [76]   Results for orders placed or performed during the hospital encounter of 12/17/23 (from the past 24 hours)  Resp Panel by RT-PCR (Flu A&B, Covid) Anterior Nasal Swab     Status: None   Collection Time: 12/17/23 12:36 PM   Specimen: Anterior Nasal Swab  Result Value Ref Range   SARS Coronavirus 2 by RT PCR NEGATIVE NEGATIVE   Influenza A by PCR NEGATIVE NEGATIVE   Influenza B by PCR NEGATIVE NEGATIVE  Urinalysis, w/ Reflex to Culture (Infection Suspected) -Urine, Clean Catch     Status: Abnormal   Collection Time: 12/17/23  1:29 PM  Result Value Ref Range   Specimen Source URINE, CLEAN CATCH    Color, Urine YELLOW YELLOW   APPearance CLEAR CLEAR   Specific Gravity, Urine 1.020 1.005 - 1.030   pH 5.5 5.0 - 8.0   Glucose, UA NEGATIVE NEGATIVE mg/dL   Hgb urine dipstick TRACE (A) NEGATIVE   Bilirubin Urine NEGATIVE NEGATIVE   Ketones, ur 40 (A) NEGATIVE mg/dL   Protein, ur 30 (A) NEGATIVE mg/dL   Nitrite NEGATIVE NEGATIVE   Leukocytes,Ua NEGATIVE NEGATIVE   Squamous Epithelial / HPF 6-10 0 - 5 /HPF   WBC, UA 0-5 0 - 5 WBC/hpf   RBC / HPF 0-5 0 - 5 RBC/hpf   Bacteria, UA FEW (A) NONE SEEN   DG Chest 2 View Result Date: 12/17/2023 CLINICAL DATA:  Fever, evaluate for pneumonia EXAM: CHEST - 2 VIEW COMPARISON:   Chest x-ray 07/22/2023 FINDINGS: There are patchy airspace opacities in the lateral right lower lobe. There is no pleural effusion or pneumothorax. The cardiomediastinal silhouette is within normal limits. No acute fractures are seen. IMPRESSION: Patchy airspace opacities in the lateral right lower lobe, concerning for pneumonia. Follow-up PA and lateral chest x-ray recommended in 4-6 weeks to confirm complete resolution. Electronically Signed   By: Tyron Gallon M.D.   On:  12/17/2023 15:13    ED Clinical Impression  1. Community acquired pneumonia of right lower lobe of lung   2. Fever, unspecified      ED Assessment/Plan    Patient presents with acute illness with systemic symptoms of fever and tachycardia.  She was given ibuprofen with improvement in her symptoms.  Outside records reviewed.  GFR from February 2025 91 mL/min  COVID, flu negative.  Will check UA.  Considered tickborne illness, patient denies tick bite in the past 6 weeks-states that she does not go outside frequently.  Will also check chest x-ray as patient reports a "light" cough.  No evidence of meningitis, intra-abdominal process.  Reviewed imaging.  Right lower lobe pneumonia as read by me and confirmed by radiology.  Sent patient MyChart note advising her of confirmed pneumonia and as needed for repeat chest x-ray in 4 to 6 weeks to ensure resolution.  Home with doxy 100 mg po bid x 5 days, refill albuterol .  Patient does not need another spacer.  Work note.  Discussed labs, imaging, MDM, treatment plan, and plan for follow-up with patient Discussed sn/sx that should prompt return to the ED. patient agrees with plan.   Meds ordered this encounter  Medications   ibuprofen (ADVIL) tablet 800 mg   doxycycline (VIBRAMYCIN) 100 MG capsule    Sig: Take 1 capsule (100 mg total) by mouth 2 (two) times daily for 5 days.    Dispense:  10 capsule    Refill:  0   albuterol  (VENTOLIN  HFA) 108 (90 Base) MCG/ACT inhaler     Sig: Inhale 2 puffs into the lungs every 4 (four) hours as needed for wheezing or shortness of breath. Dispense with aerochamber    Dispense:  1 each    Refill:  0   ibuprofen (ADVIL) 600 MG tablet    Sig: Take 1 tablet (600 mg total) by mouth every 8 (eight) hours as needed.    Dispense:  30 tablet    Refill:  0      *This clinic note was created using Scientist, clinical (histocompatibility and immunogenetics). Therefore, there may be occasional mistakes despite careful proofreading. ?    Ethlyn Herd, MD 12/18/23 1304

## 2024-01-04 ENCOUNTER — Ambulatory Visit: Admission: EM | Admit: 2024-01-04 | Discharge: 2024-01-04 | Disposition: A | Discharge status: Home / Self Care

## 2024-01-04 DIAGNOSIS — L42 Pityriasis rosea: Secondary | ICD-10-CM

## 2024-01-04 DIAGNOSIS — R21 Rash and other nonspecific skin eruption: Secondary | ICD-10-CM

## 2024-01-04 DIAGNOSIS — Z8679 Personal history of other diseases of the circulatory system: Secondary | ICD-10-CM | POA: Diagnosis not present

## 2024-01-04 NOTE — ED Triage Notes (Signed)
 Pt c/o body rash and itching x3days  Pt states that she went to a pool party and used her friends shower and soap over the weekend  Pt states that since then she had 3 spots along her arms appear that began to itch.  Pt states that the rash has spread from her arms to under her breasts and along her stomach. Pt states that the rash is also along her buttock and inner arm.   Pt has 3 discolored spots along her inner arms and is worried about ring worm.

## 2024-01-04 NOTE — ED Provider Notes (Signed)
 [facility]    CSN: [id number] Arrival date & time: 01/04/24  1741      History   Chief Complaint Chief Complaint  Patient presents with   Rash    HPI Jessica Nguyen is a 48 y.o. female.   48 year old female, Jessica Nguyen, presents to urgent care for evaluation of body rash for 3 days.  Patient states she recently went to a pool party and used someone else's of and is concerned she contracted some type of contagious rash.  Patient endorses itching.  The history is provided by the patient. No language interpreter was used.    Past Medical History:  Diagnosis Date   Anemia    Asthma    Obese     Patient Active Problem List   Diagnosis Date Noted   Rash and nonspecific skin eruption 01/04/2024   Pityriasis rosea 01/04/2024   History of hypertension 01/04/2024   Strep throat 12/30/2022    Past Surgical History:  Procedure Laterality Date   CESAREAN SECTION     CHOLECYSTECTOMY      OB History   No obstetric history on file.      Home Medications    Prior to Admission medications   Medication Sig Start Date End Date Taking? Authorizing Provider  albuterol  (VENTOLIN  HFA) 108 (90 Base) MCG/ACT inhaler Inhale 1-2 puffs into the lungs every 6 (six) hours as needed for wheezing or shortness of breath. 07/22/23  Yes Floydene Hy, PA-C  albuterol  (VENTOLIN  HFA) 108 (90 Base) MCG/ACT inhaler Inhale 2 puffs into the lungs every 4 (four) hours as needed for wheezing or shortness of breath. Dispense with aerochamber 12/17/23  Yes Ethlyn Herd, MD  Ascorbic Acid (VITAMIN C) 500 MG CAPS Take by mouth.   Yes [provider]  aspirin EC 81 MG tablet Take 81 mg by mouth daily.   Yes [provider]  ferrous sulfate 325 (65 FE) MG tablet Take 325 mg by mouth daily with breakfast.   Yes [provider]  hydrochlorothiazide (HYDRODIURIL) 12.5 MG tablet Take by mouth. 04/16/23 04/15/24 Yes [provider]  ibuprofen  (ADVIL ) 600  MG tablet Take 1 tablet (600 mg total) by mouth every 8 (eight) hours as needed. 12/17/23  Yes Ethlyn Herd, MD  Multiple Vitamins-Minerals (MULTIVITAMIN ADULT PO) Take 1 tablet by mouth.   Yes [provider]  Omega-3 Fatty Acids (FISH OIL) 1000 MG CAPS Take by mouth.   Yes [provider]  Spacer/Aero-Holding Chambers (AEROCHAMBER MV) inhaler Use as instructed 04/07/22  Yes Ethlyn Herd, MD  furosemide  (LASIX ) 20 MG tablet Take 1 tablet (20 mg total) by mouth daily for 5 days. 03/18/23 03/23/23  Reena Canning, NP    Family History Family History  Problem Relation Age of Onset   Cancer Mother    Hypertension Mother    Renal cancer Father    Brain cancer Father     Social History Social History   Tobacco Use   Smoking status: Never   Smokeless tobacco: Never  Vaping Use   Vaping status: Never Used  Substance Use Topics   Alcohol use: Yes    Comment: occasionally   Drug use: Yes    Types: Marijuana    Comment: last use a month ago     Allergies   Penicillins   Review of Systems Review of Systems  Constitutional:  Negative for fever.  Skin:  Positive for color change and rash.  All other systems reviewed and  are negative.    Physical Exam Triage Vital Signs ED Triage Vitals  Encounter Vitals Group     BP 01/04/24 1841 (!) 157/109     Girls Systolic BP Percentile --      Girls Diastolic BP Percentile --      Boys Systolic BP Percentile --      Boys Diastolic BP Percentile --      Pulse Rate 01/04/24 1841 93     Resp --      Temp 01/04/24 1841 98.4 F (36.9 C)     Temp Source 01/04/24 1841 Oral     SpO2 01/04/24 1841 99 %     Weight 01/04/24 1845 (!) 333 lb (151 kg)     Height 01/04/24 1845 5' 8 (1.727 m)     Head Circumference --      Peak Flow --      Pain Score 01/04/24 1843 0     Pain Loc --      Pain Education --      Exclude from Growth Chart --    No data found.  Updated Vital Signs BP (!) 157/109 (BP Location: Left  Arm)   Pulse 93   Temp 98.4 F (36.9 C) (Oral)   Ht 5' 8 (1.727 m)   Wt (!) 333 lb (151 kg)   SpO2 99%   BMI 50.63 kg/m   Visual Acuity Right Eye Distance:   Left Eye Distance:   Bilateral Distance:    Right Eye Near:   Left Eye Near:    Bilateral Near:     Physical Exam Vitals and nursing note reviewed.   Skin:    General: Skin is warm.     Capillary Refill: Capillary refill takes less than 2 seconds.     Findings: Rash present. Rash is macular and papular. Rash is not crusting, nodular, purpuric, pustular, scaling, urticarial or vesicular.     Comments: Rash to trunk in Christmas tree pattern   Neurological:     General: No focal deficit present.     Mental Status: She is alert and oriented to person, place, and time.     GCS: GCS eye subscore is 4. GCS verbal subscore is 5. GCS motor subscore is 6.   Psychiatric:        Attention and Perception: Attention normal.        Mood and Affect: Mood normal.        Speech: Speech normal.        Behavior: Behavior normal.      UC Treatments / Results  Labs (all labs ordered are listed, but only abnormal results are displayed) Labs Reviewed - No data to display  EKG   Radiology No results found.  Procedures Procedures (including critical care time)  Medications Ordered in UC Medications - No data to display  Initial Impression / Assessment and Plan / UC Course  I have reviewed the triage vital signs and the nursing notes.  Pertinent labs & imaging results that were available during my care of the patient were reviewed by me and considered in my medical decision making (see chart for details).    Discussed exam findings and plan of care with patient, referral given to dermatology if symptoms persist , avoid heat hot water as makes rashes worse , over-the-counter medication options given , strict go to ER precautions given , patient verbalized understanding this provider  Ddx: Pityriasis rosea, rash,  allergies, scabies , insect bite , history  of hypertension Final Clinical Impressions(s) / UC Diagnoses   Final diagnoses:  Pityriasis rosea  Rash and nonspecific skin eruption  History of hypertension     Discharge Instructions      Avoid heat ,hot water as it makes rashes worse.  May use calamine lotion or selsun blue shampoo, apply topically.  May take over-the-counter Zyrtec  or Benadryl as label directed for itching Follow up with PCP.  I have also given you a referral to dermatologist if symptoms persist-call for appointment GO to Er for new or worsening issues.      ED Prescriptions   None    PDMP not reviewed this encounter.   Peter Brands, NP 01/04/24 1914

## 2024-01-04 NOTE — Discharge Instructions (Addendum)
 Avoid heat ,hot water as it makes rashes worse.  May use calamine lotion or selsun blue shampoo, apply topically.  May take over-the-counter Zyrtec  or Benadryl as label directed for itching Follow up with PCP.  I have also given you a referral to dermatologist if symptoms persist-call for appointment GO to Er for new or worsening issues.

## 2024-03-30 ENCOUNTER — Ambulatory Visit (INDEPENDENT_AMBULATORY_CARE_PROVIDER_SITE_OTHER)

## 2024-03-30 ENCOUNTER — Ambulatory Visit: Payer: Self-pay | Admitting: Physician Assistant

## 2024-03-30 ENCOUNTER — Ambulatory Visit
Admission: EM | Admit: 2024-03-30 | Discharge: 2024-03-30 | Disposition: A | Discharge status: Home / Self Care | Attending: Physician Assistant | Admitting: Physician Assistant

## 2024-03-30 ENCOUNTER — Encounter: Payer: Self-pay | Admitting: Emergency Medicine

## 2024-03-30 DIAGNOSIS — J189 Pneumonia, unspecified organism: Secondary | ICD-10-CM

## 2024-03-30 DIAGNOSIS — R0602 Shortness of breath: Secondary | ICD-10-CM

## 2024-03-30 DIAGNOSIS — J45901 Unspecified asthma with (acute) exacerbation: Secondary | ICD-10-CM

## 2024-03-30 DIAGNOSIS — R051 Acute cough: Secondary | ICD-10-CM

## 2024-03-30 MED ORDER — PROMETHAZINE-DM 6.25-15 MG/5ML PO SYRP
5.0000 mL | ORAL_SOLUTION | Freq: Four times a day (QID) | ORAL | 0 refills | Status: DC | PRN
Start: 2024-03-30 — End: 2024-05-08

## 2024-03-30 MED ORDER — LEVOFLOXACIN 750 MG PO TABS: 750.0000 mg | ORAL_TABLET | Freq: Every day | ORAL | 0 refills | Status: AC

## 2024-03-30 MED ORDER — PREDNISONE 20 MG PO TABS: 40.0000 mg | ORAL_TABLET | Freq: Every day | ORAL | 0 refills | Status: AC

## 2024-03-30 MED ORDER — ALBUTEROL SULFATE (2.5 MG/3ML) 0.083% IN NEBU
2.5000 mg | INHALATION_SOLUTION | Freq: Once | RESPIRATORY_TRACT | Status: AC
  Administered 2024-03-30: 2.5 mg via RESPIRATORY_TRACT

## 2024-03-30 NOTE — ED Provider Notes (Signed)
 [facility]    CSN: [id number] Arrival date & time: 03/30/24  0859      History   Chief Complaint Chief Complaint  Patient presents with   Sore Throat   Cough    HPI Jessica Nguyen is a 48 y.o. female presenting for 4-day history of cough, fatigue, body aches, and congestion. Reports feeling short of breath more than normal. Patient says her cough is productive.  No fever, sore throat, chest pain, vomiting or diarrhea.  Does have history of asthma.  She does think she is having more breathing difficulty than normal.  Has been using her albuterol  inhaler and taking Mucinex.  She is worried about pneumonia. She had pneumonia 3 months ago.  No other complaints.  HPI  Past Medical History:  Diagnosis Date   Anemia    Asthma    Obese     Patient Active Problem List   Diagnosis Date Noted   Rash and nonspecific skin eruption 01/04/2024   Pityriasis rosea 01/04/2024   History of hypertension 01/04/2024   Strep throat 12/30/2022    Past Surgical History:  Procedure Laterality Date   CESAREAN SECTION     CHOLECYSTECTOMY      OB History   No obstetric history on file.      Home Medications    Prior to Admission medications   Medication Sig Start Date End Date Taking? Authorizing Provider  levofloxacin  (LEVAQUIN ) 750 MG tablet Take 1 tablet (750 mg total) by mouth daily for 5 days. 03/30/24 04/04/24 Yes Arvis Jolan NOVAK, PA-C  predniSONE  (DELTASONE ) 20 MG tablet Take 2 tablets (40 mg total) by mouth daily for 5 days. 03/30/24 04/04/24 Yes Arvis Jolan NOVAK, PA-C  promethazine -dextromethorphan (PROMETHAZINE -DM) 6.25-15 MG/5ML syrup Take 5 mLs by mouth 4 (four) times daily as needed. 03/30/24  Yes Arvis Jolan B, PA-C  albuterol  (VENTOLIN  HFA) 108 (90 Base) MCG/ACT inhaler Inhale 1-2 puffs into the lungs every 6 (six) hours as needed for wheezing or shortness of breath. 07/22/23   Arvis Jolan NOVAK, PA-C  albuterol  (VENTOLIN  HFA) 108 (90 Base) MCG/ACT inhaler Inhale 2  puffs into the lungs every 4 (four) hours as needed for wheezing or shortness of breath. Dispense with aerochamber 12/17/23   Van Knee, MD  Ascorbic Acid (VITAMIN C) 500 MG CAPS Take by mouth.    [provider]  aspirin EC 81 MG tablet Take 81 mg by mouth daily.    [provider]  ferrous sulfate 325 (65 FE) MG tablet Take 325 mg by mouth daily with breakfast.    [provider]  furosemide  (LASIX ) 20 MG tablet Take 1 tablet (20 mg total) by mouth daily for 5 days. 03/18/23 03/23/23  Teresa Shelba SAUNDERS, NP  hydrochlorothiazide (HYDRODIURIL) 12.5 MG tablet Take by mouth. 04/16/23 04/15/24  [provider]  ibuprofen  (ADVIL ) 600 MG tablet Take 1 tablet (600 mg total) by mouth every 8 (eight) hours as needed. 12/17/23   Van Knee, MD  Multiple Vitamins-Minerals (MULTIVITAMIN ADULT PO) Take 1 tablet by mouth.    [provider]  Omega-3 Fatty Acids (FISH OIL) 1000 MG CAPS Take by mouth.    [provider]  Spacer/Aero-Holding Chambers (AEROCHAMBER MV) inhaler Use as instructed 04/07/22   Van Knee, MD    Family History Family History  Problem Relation Age of Onset   Cancer Mother    Hypertension Mother    Renal cancer Father    Brain cancer Father     Social  History Social History   Tobacco Use   Smoking status: Never   Smokeless tobacco: Never  Vaping Use   Vaping status: Never Used  Substance Use Topics   Alcohol use: Yes    Comment: occasionally   Drug use: Yes    Types: Marijuana    Comment: last use a month ago     Allergies   Penicillins   Review of Systems Review of Systems  Constitutional:  Positive for fatigue. Negative for chills, diaphoresis and fever.  HENT:  Positive for congestion and rhinorrhea. Negative for ear pain, sinus pressure, sinus pain and sore throat.   Respiratory:  Positive for cough, shortness of breath and wheezing.   Cardiovascular:  Negative for chest pain.   Gastrointestinal:  Negative for abdominal pain, nausea and vomiting.  Musculoskeletal:  Positive for myalgias.  Skin:  Negative for rash.  Neurological:  Negative for weakness and headaches.  Hematological:  Negative for adenopathy.     Physical Exam Triage Vital Signs ED Triage Vitals  Encounter Vitals Group     BP 07/22/23 1105 (!) 151/98     Systolic BP Percentile --      Diastolic BP Percentile --      Pulse Rate 07/22/23 1105 96     Resp 07/22/23 1105 15     Temp 07/22/23 1105 98.7 F (37.1 C)     Temp Source 07/22/23 1105 Oral     SpO2 07/22/23 1105 100 %     Weight 07/22/23 1104 (!) 320 lb 1.7 oz (145.2 kg)     Height 07/22/23 1104 5' 8 (1.727 m)     Head Circumference --      Peak Flow --      Pain Score 07/22/23 1104 0     Pain Loc --      Pain Education --      Exclude from Growth Chart --    No data found.  Updated Vital Signs BP (!) 135/92 (BP Location: Left Arm)   Pulse 78   Temp 98.7 F (37.1 C) (Oral)   Resp 16   Ht 5' 8 (1.727 m)   Wt (!) 332 lb 14.3 oz (151 kg)   LMP 02/29/2024 (Approximate)   SpO2 95%   BMI 50.62 kg/m    Physical Exam Vitals and nursing note reviewed.  Constitutional:      General: She is not in acute distress.    Appearance: Normal appearance. She is well-developed. She is ill-appearing. She is not toxic-appearing.  HENT:     Head: Normocephalic and atraumatic.     Nose: Congestion present.     Mouth/Throat:     Mouth: Mucous membranes are moist.     Pharynx: Oropharynx is clear.  Eyes:     General: No scleral icterus.       Right eye: No discharge.        Left eye: No discharge.     Conjunctiva/sclera: Conjunctivae normal.  Cardiovascular:     Rate and Rhythm: Normal rate and regular rhythm.     Heart sounds: Normal heart sounds.  Pulmonary:     Effort: Pulmonary effort is normal. No respiratory distress.     Breath sounds: Wheezing present.  Musculoskeletal:     Cervical back: Neck supple.  Skin:     General: Skin is dry.  Neurological:     General: No focal deficit present.     Mental Status: She is alert. Mental status is at baseline.  Motor: No weakness.     Gait: Gait normal.  Psychiatric:        Mood and Affect: Mood normal.        Behavior: Behavior normal.      UC Treatments / Results  Labs (all labs ordered are listed, but only abnormal results are displayed) Labs Reviewed - No data to display   EKG   Radiology DG Chest 2 View Result Date: 03/30/2024 EXAM: 2 VIEW(S) XRAY OF THE CHEST 03/30/2024 09:55:00 AM COMPARISON: 12/17/2023 Resolved right lung base opacity. CLINICAL HISTORY: Cough congestion SOB x 4 day. Hx asthma. Hx PNA a couple months ago. Patient c/o sore throat, cough, chest congestion that started on Wed night. Patient denies fevers. Patient does not want testing at this time. FINDINGS: LUNGS AND PLEURA: No focal pulmonary opacity. No pulmonary edema. No pleural effusion. No pneumothorax. HEART AND MEDIASTINUM: No acute abnormality of the cardiac and mediastinal silhouettes. BONES AND SOFT TISSUES: No acute osseous abnormality. IMPRESSION: 1. No acute process. 2. Resolved right lung base opacity. Electronically signed by: Katheleen Faes MD 03/30/2024 10:13 AM EDT RP Workstation: [id number]     Procedures Procedures (including critical care time)  Medications Ordered in UC Medications  albuterol  (PROVENTIL ) (2.5 MG/3ML) 0.083% nebulizer solution 2.5 mg (2.5 mg Nebulization Given 03/30/24 0928)    Initial Impression / Assessment and Plan / UC Course  I have reviewed the triage vital signs and the nursing notes.  Pertinent labs & imaging results that were available during my care of the patient were reviewed by me and considered in my medical decision making (see chart for details).   48 year old female with history of asthma presenting for 4-day history of cough, congestion, and increased shortness of breath from baseline.  Using albuterol  inhaler and  Mucinex.  Vital stable.  She is afebrile.  She is mildly ill-appearing but nontoxic.  She does have somewhat decreased breath sounds in all lung fields and few scattered wheezes.   Patient declined COVID and strep testing.  Chest x-ray obtained.  Concern for continued right lower lobe pneumonia based on comparison to x-ray from June 2025 as noted on wet read.  Pending overread by radiologist.  Discussed my concerns with her.  She has history of swelling to penicillin and avoids all penicillin and related drugs.  She took doxycycline  a couple months ago so we will treat at this time with 5-day course of Levaquin .  Also sent prednisone  for asthma exacerbation and Promethazine  DM.  Continue albuterol  at home. Encouraged increasing rest and fluids.  Reviewed return and ER precautions.    1 acute illness with systemic symptoms.  Flareup of chronic underlying condition.  Chest x-ray over read is negative for pneumonia.  Patient a MyChart message with the radiologist findings.  I still have my own concerns so I would like her to take the Levaquin  but she does not need to repeat the x-ray in a few weeks unless her symptoms are not resolved or if they worsen.   Final Clinical Impressions(s) / UC Diagnoses   Final diagnoses:  Shortness of breath  Pneumonia of right lower lobe due to infectious organism  Acute cough  Asthma with acute exacerbation, unspecified asthma severity, unspecified whether persistent     Discharge Instructions      - Concern for pneumonia with the right lower lobe.  I sent antibiotics to pharmacy as well as prednisone  cough medication.  Continue inhalers at home.  Increase rest and fluids. - Repeat x-ray in  4 to 6 weeks. - Go to ER if uncontrolled fever, weakness or increased breathing problem.      ED Prescriptions     Medication Sig Dispense Auth. Provider   predniSONE  (DELTASONE ) 20 MG tablet Take 2 tablets (40 mg total) by mouth daily for 5 days. 10 tablet Arvis Jolan NOVAK, PA-C   levofloxacin  (LEVAQUIN ) 750 MG tablet Take 1 tablet (750 mg total) by mouth daily for 5 days. 5 tablet Arvis Jolan B, PA-C   promethazine -dextromethorphan (PROMETHAZINE -DM) 6.25-15 MG/5ML syrup Take 5 mLs by mouth 4 (four) times daily as needed. 118 mL Arvis Jolan NOVAK, PA-C      PDMP not reviewed this encounter.      Arvis Jolan NOVAK, PA-C 03/30/24 1024

## 2024-03-30 NOTE — Discharge Instructions (Signed)
-   Concern for pneumonia with the right lower lobe.  I sent antibiotics to pharmacy as well as prednisone  cough medication.  Continue inhalers at home.  Increase rest and fluids. - Repeat x-ray in 4 to 6 weeks. - Go to ER if uncontrolled fever, weakness or increased breathing problem.

## 2024-03-30 NOTE — ED Triage Notes (Addendum)
 Patient c/o sore throat, cough, chest congestion that started on Wed night.  Patient denies fevers. Patient does not want testing at this time.

## 2024-05-08 ENCOUNTER — Ambulatory Visit
Admission: EM | Admit: 2024-05-08 | Discharge: 2024-05-08 | Disposition: A | Discharge status: Home / Self Care | Attending: Emergency Medicine | Admitting: Emergency Medicine

## 2024-05-08 DIAGNOSIS — R519 Headache, unspecified: Secondary | ICD-10-CM | POA: Diagnosis not present

## 2024-05-08 MED ORDER — NAPROXEN 500 MG PO TABS: 500.0000 mg | ORAL_TABLET | Freq: Two times a day (BID) | ORAL | 0 refills | Status: DC

## 2024-05-08 NOTE — ED Provider Notes (Signed)
 [facility]    CSN: [id number] Arrival date & time: 05/08/24  1701      History   Chief Complaint No chief complaint on file.   HPI Jessica Nguyen is a 48 y.o. female.   HPI  48 year old female with past medical history significant for obesity, asthma, and anemia presents for evaluation of 2 days worth of a frontal headache.  She reports that on the first day her headache was a 9/10 and it was constant.  Over the last 2 days her headache has come down to first a 7/10 and today its a 4-5/10 and it has been intermittent.  She describes it as a dull pressure.  She has taken Motrin  with some improvement of symptoms.  She denies any change in vision, nausea or vomiting, fever, nasal congestion or nasal discharge.  No history of headaches.  Past Medical History:  Diagnosis Date   Anemia    Asthma    Obese     Patient Active Problem List   Diagnosis Date Noted   Rash and nonspecific skin eruption 01/04/2024   Pityriasis rosea 01/04/2024   History of hypertension 01/04/2024   Strep throat 12/30/2022    Past Surgical History:  Procedure Laterality Date   CESAREAN SECTION     CHOLECYSTECTOMY      OB History   No obstetric history on file.      Home Medications    Prior to Admission medications   Medication Sig Start Date End Date Taking? Authorizing Provider  albuterol  (VENTOLIN  HFA) 108 (90 Base) MCG/ACT inhaler Inhale 1-2 puffs into the lungs every 6 (six) hours as needed for wheezing or shortness of breath. 07/22/23  Yes Arvis Jolan NOVAK, PA-C  albuterol  (VENTOLIN  HFA) 108 (90 Base) MCG/ACT inhaler Inhale 2 puffs into the lungs every 4 (four) hours as needed for wheezing or shortness of breath. Dispense with aerochamber 12/17/23  Yes Mortenson, Ashley, MD  aspirin EC 81 MG tablet Take 81 mg by mouth daily.   Yes [provider]  ibuprofen  (ADVIL ) 600 MG tablet Take 1 tablet (600 mg total) by mouth every 8 (eight) hours as needed. 12/17/23  Yes Van Knee, MD  naproxen  (NAPROSYN ) 500 MG tablet Take 1 tablet (500 mg total) by mouth 2 (two) times daily. 05/08/24  Yes Bernardino Ditch, NP  Ascorbic Acid (VITAMIN C) 500 MG CAPS Take by mouth.    [provider]  ferrous sulfate 325 (65 FE) MG tablet Take 325 mg by mouth daily with breakfast.    [provider]  furosemide  (LASIX ) 20 MG tablet Take 1 tablet (20 mg total) by mouth daily for 5 days. 03/18/23 03/23/23  Teresa Shelba SAUNDERS, NP  hydrochlorothiazide (HYDRODIURIL) 12.5 MG tablet Take by mouth. 04/16/23 04/15/24  [provider]  Multiple Vitamins-Minerals (MULTIVITAMIN ADULT PO) Take 1 tablet by mouth.    [provider]  Omega-3 Fatty Acids (FISH OIL) 1000 MG CAPS Take by mouth.    [provider]  Spacer/Aero-Holding Chambers (AEROCHAMBER MV) inhaler Use as instructed 04/07/22   Van Knee, MD    Family History Family History  Problem Relation Age of Onset   Cancer Mother    Hypertension Mother    Renal cancer Father    Brain cancer Father     Social History Social History   Tobacco Use   Smoking status: Never   Smokeless tobacco: Never  Vaping Use   Vaping status: Never Used  Substance Use Topics  Alcohol use: Yes    Comment: occasionally   Drug use: Yes    Types: Marijuana    Comment: last use a month ago     Allergies   Penicillins   Review of Systems Review of Systems  Eyes:  Negative for visual disturbance.  Gastrointestinal:  Negative for nausea and vomiting.  Neurological:  Positive for headaches. Negative for weakness and numbness.     Physical Exam Triage Vital Signs ED Triage Vitals  Encounter Vitals Group     BP      Girls Systolic BP Percentile      Girls Diastolic BP Percentile      Boys Systolic BP Percentile      Boys Diastolic BP Percentile      Pulse      Resp      Temp      Temp src      SpO2      Weight      Height      Head Circumference      Peak Flow      Pain Score      Pain  Loc      Pain Education      Exclude from Growth Chart    No data found.  Updated Vital Signs BP (!) 166/98 (BP Location: Left Arm)   Pulse 79   Temp 98.9 F (37.2 C) (Oral)   Resp 18   LMP 05/08/2024   SpO2 98%   Visual Acuity Right Eye Distance:   Left Eye Distance:   Bilateral Distance:    Right Eye Near:   Left Eye Near:    Bilateral Near:     Physical Exam Vitals and nursing note reviewed.  Constitutional:      Appearance: Normal appearance. She is not ill-appearing.  Eyes:     General:        Right eye: No discharge.        Left eye: No discharge.     Extraocular Movements: Extraocular movements intact.     Pupils: Pupils are equal, round, and reactive to light.  Skin:    General: Skin is warm and dry.     Capillary Refill: Capillary refill takes less than 2 seconds.  Neurological:     General: No focal deficit present.     Mental Status: She is alert and oriented to person, place, and time.     Cranial Nerves: No cranial nerve deficit.     Sensory: No sensory deficit.     Motor: No weakness.     Coordination: Coordination normal.     Deep Tendon Reflexes: Reflexes normal.      UC Treatments / Results  Labs (all labs ordered are listed, but only abnormal results are displayed) Labs Reviewed - No data to display  EKG   Radiology No results found.  Procedures Procedures (including critical care time)  Medications Ordered in UC Medications - No data to display  Initial Impression / Assessment and Plan / UC Course  I have reviewed the triage vital signs and the nursing notes.  Pertinent labs & imaging results that were available during my care of the patient were reviewed by me and considered in my medical decision making (see chart for details).   The patient is a pleasant, nontoxic-appearing 48 year old female presenting for evaluation of 2 days worth of frontal headache as outlined.  She reports that she does not have a history of headaches.   She denies  any head trauma.  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  She has worn glasses for reading in the past but does not currently have glasses and she has not seen an eye doctor recently.  On exam patient's cranial nerves II through XII are intact.  Pupils equal and reactive and EOMs intact.  Bilateral grips, upper extremity strength, lower extremity strength, are all 5/5.  Patient has normal finger-nose and heel-to-shin.  I suspect that the patient's headache may be result of eyestrain as she indicates it is more near the inner canthus of her eyes and possibly also stress related.  We will do a trial of 500 mg Naprosyn  twice daily to see if it improves her headache and I also recommended that she get an eye exam to determine if she needs eyeglasses and see if this improves her symptoms.  Return precautions reviewed.  Work note provided.   Final Clinical Impressions(s) / UC Diagnoses   Final diagnoses:  Frontal headache     Discharge Instructions      As we discussed, I suspect that your headache is accommodation of stress and also possible eyestrain.  I would encourage you to make an appointment with an eye doctor to have them in eye exam to determine if you need glasses, especially given that you are spending more time in front of a computer which can increase your eyestrain.  Take the 500 milligrams of Naprosyn  twice daily with food to see if this improves her headache.  You may take supplemental Tylenol with this medication but do not take ibuprofen  or Motrin .  If you develop any new or worsening symptoms please return for reevaluation or seek care in the ER.     ED Prescriptions     Medication Sig Dispense Auth. Provider   naproxen  (NAPROSYN ) 500 MG tablet Take 1 tablet (500 mg total) by mouth 2 (two) times daily. 30 tablet  Bernardino Ditch, NP      PDMP not reviewed this encounter.   Bernardino Ditch, NP 05/08/24 1850

## 2024-05-08 NOTE — Discharge Instructions (Signed)
 As we discussed, I suspect that your headache is accommodation of stress and also possible eyestrain.  I would encourage you to make an appointment with an eye doctor to have them in eye exam to determine if you need glasses, especially given that you are spending more time in front of a computer which can increase your eyestrain.  Take the 500 milligrams of Naprosyn  twice daily with food to see if this improves her headache.  You may take supplemental Tylenol with this medication but do not take ibuprofen  or Motrin .  If you develop any new or worsening symptoms please return for reevaluation or seek care in the ER.

## 2024-05-08 NOTE — ED Triage Notes (Signed)
 Patient presents to the office for frontal headache that started x2 days ago. Has taken Motrin  for relief.

## 2024-07-21 ENCOUNTER — Ambulatory Visit
Admission: EM | Admit: 2024-07-21 | Discharge: 2024-07-21 | Disposition: A | Discharge status: Home / Self Care | Attending: Family Medicine | Admitting: Family Medicine

## 2024-07-21 ENCOUNTER — Encounter: Payer: Self-pay | Admitting: Emergency Medicine

## 2024-07-21 DIAGNOSIS — F430 Acute stress reaction: Secondary | ICD-10-CM | POA: Insufficient documentation

## 2024-07-21 DIAGNOSIS — R030 Elevated blood-pressure reading, without diagnosis of hypertension: Secondary | ICD-10-CM | POA: Insufficient documentation

## 2024-07-21 DIAGNOSIS — G4486 Cervicogenic headache: Secondary | ICD-10-CM | POA: Insufficient documentation

## 2024-07-21 DIAGNOSIS — R42 Dizziness and giddiness: Secondary | ICD-10-CM

## 2024-07-21 LAB — HIV ANTIBODY (ROUTINE TESTING W REFLEX): HIV Screen 4th Generation wRfx: NONREACTIVE

## 2024-07-21 MED ORDER — CYCLOBENZAPRINE HCL 5 MG PO TABS: 5.0000 mg | ORAL_TABLET | Freq: Every evening | ORAL | 0 refills | Status: AC | PRN

## 2024-07-21 MED ORDER — IBUPROFEN 800 MG PO TABS: 800.0000 mg | ORAL_TABLET | Freq: Three times a day (TID) | ORAL | 0 refills | Status: AC | PRN

## 2024-07-21 NOTE — ED Triage Notes (Addendum)
 Pt c/o a headache that started today. She taken motrin  for the pain. She also has vaginal itching x 2 days. Pt would like STD testing.

## 2024-07-21 NOTE — ED Provider Notes (Addendum)
 " [facility]    CSN: [id number] Arrival date & time: 07/21/24  1741      History   Chief Complaint Chief Complaint  Patient presents with   Headache   Vaginal Itching    HPI Jessica Nguyen is a 49 y.o. female.   HPI   [name] presents for headache with dizzy spells.  Thought she needed to eat. She has been skipping meals so she tried to eat something and that didn't help her headache. She tried resting and Motrin .   She thought it was blood pressure being elevated due to the stress. She got a call about a possible STD exposure.  She and her husband are going through an emotional roller coaster. She has vaginal itching for the past 3 days. She was told that her husband was messing around with someone else. She is unsure if he had been using condoms.   She hasn't had a period for the past 5-6 months but she had some vaginal bleeding today. No LMP recorded. Patient is perimenopausal. She had a tubal ligation.    Nausea/vomiting: yes, nausea only  Photophobia/phonophobia: no  Tearing of eyes: no  Sinus pain/pressure: no  Family hx migraine: no  Personal stressors: yes  Relation to menstrual cycle: no   Fever: no  Neck pain/stiffness: no  Vision/speech/swallow/hearing difficulty: no  Focal weakness/numbness: no  Altered mental status: no  Trauma: no  New type of headache: no  Anticoagulant use: no  H/o cancer/HIV/Pregnancy: no       Past Medical History:  Diagnosis Date   Anemia    Asthma    Obese     Patient Active Problem List   Diagnosis Date Noted   Rash and nonspecific skin eruption 01/04/2024   Pityriasis rosea 01/04/2024   History of hypertension 01/04/2024   Strep throat 12/30/2022    Past Surgical History:  Procedure Laterality Date   CESAREAN SECTION     CHOLECYSTECTOMY      OB History   No obstetric history on file.      Home Medications    Prior to Admission medications  Medication Sig Start Date End Date Taking?  Authorizing Provider  cyclobenzaprine  (FLEXERIL ) 5 MG tablet Take 1 tablet (5 mg total) by mouth at bedtime as needed. 07/21/24  Yes Hollister Wessler, DO  albuterol  (VENTOLIN  HFA) 108 (90 Base) MCG/ACT inhaler Inhale 1-2 puffs into the lungs every 6 (six) hours as needed for wheezing or shortness of breath. 07/22/23   Arvis Jolan NOVAK, PA-C  albuterol  (VENTOLIN  HFA) 108 (90 Base) MCG/ACT inhaler Inhale 2 puffs into the lungs every 4 (four) hours as needed for wheezing or shortness of breath. Dispense with aerochamber 12/17/23   Van Knee, MD  Ascorbic Acid (VITAMIN C) 500 MG CAPS Take by mouth.    [provider]  aspirin EC 81 MG tablet Take 81 mg by mouth daily.    [provider]  ferrous sulfate 325 (65 FE) MG tablet Take 325 mg by mouth daily with breakfast.    [provider]  furosemide  (LASIX ) 20 MG tablet Take 1 tablet (20 mg total) by mouth daily for 5 days. 03/18/23 03/23/23  Teresa Shelba SAUNDERS, NP  hydrochlorothiazide (HYDRODIURIL) 12.5 MG tablet Take by mouth. 04/16/23 04/15/24  [provider]  ibuprofen  (ADVIL ) 800 MG tablet Take 1 tablet (800 mg total) by mouth every 8 (eight) hours as needed. 07/21/24   Amethyst Gainer, DO  Multiple Vitamins-Minerals (MULTIVITAMIN ADULT PO) Take 1  tablet by mouth.    [provider]  Omega-3 Fatty Acids (FISH OIL) 1000 MG CAPS Take by mouth.    [provider]  Spacer/Aero-Holding Chambers (AEROCHAMBER MV) inhaler Use as instructed 04/07/22   Van Knee, MD    Family History Family History  Problem Relation Age of Onset   Cancer Mother    Hypertension Mother    Renal cancer Father    Brain cancer Father     Social History Social History[1]   Allergies   Penicillins   Review of Systems Review of Systems: negative unless otherwise stated in HPI.      Physical Exam Triage Vital Signs ED Triage Vitals  Encounter Vitals Group     BP 07/21/24 1814 (!) 144/84     Girls Systolic BP  Percentile --      Girls Diastolic BP Percentile --      Boys Systolic BP Percentile --      Boys Diastolic BP Percentile --      Pulse Rate 07/21/24 1814 87     Resp 07/21/24 1814 18     Temp 07/21/24 1814 98.4 F (36.9 C)     Temp Source 07/21/24 1814 Oral     SpO2 07/21/24 1814 98 %     Weight 07/21/24 1809 (!) 333 lb (151 kg)     Height --      Head Circumference --      Peak Flow --      Pain Score 07/21/24 1811 7     Pain Loc --      Pain Education --      Exclude from Growth Chart --    No data found.  Updated Vital Signs BP (!) 144/84 (BP Location: Left Arm)   Pulse 87   Temp 98.4 F (36.9 C) (Oral)   Resp 18   Wt (!) 151 kg   SpO2 98%   BMI 50.63 kg/m   Visual Acuity Right Eye Distance:   Left Eye Distance:   Bilateral Distance:    Right Eye Near:   Left Eye Near:    Bilateral Near:     Physical Exam GEN:     alert, non-toxic appearing female in no distress    HENT:  mucus membranes moist, oropharyngeal without lesions or erythema ,  uvula midline, no nasal discharge  EYES:   pupils equal and reactive, EOM intact, no injection or discharge  NECK:  supple, normal ROM, no midline C-spine TTP  RESP:  clear to auscultation bilaterally, no increased work of breathing  CVS:   regular rate and rhythm EXT:   No thoracic or lumbar midline TTP  NEURO:  alert, oriented, speech normal, CN 2-12 grossly intact, no facial droop,  sensation grossly intact, strength 5/5 bilateral UE and LE, normal coordination GU: Deferred, patient performed self swab Psych:  Cognition and judgment appear intact. Alert, communicative  and cooperative with normal attention span and concentration. No apparent delusions, illusions, hallucinations Skin:   warm and dry     UC Treatments / Results  Labs (all labs ordered are listed, but only abnormal results are displayed) Labs Reviewed  HIV ANTIBODY (ROUTINE TESTING W REFLEX)  SYPHILIS: RPR W/REFLEX TO RPR TITER AND TREPONEMAL  ANTIBODIES, TRADITIONAL SCREENING AND DIAGNOSIS ALGORITHM  CERVICOVAGINAL ANCILLARY ONLY    EKG  If EKG performed, see my interpretation in the MDM section  Radiology No results found.   Procedures Procedures (including critical care time)  Medications Ordered in  UC Medications - No data to display  Initial Impression / Assessment and Plan / UC Course  I have reviewed the triage vital signs and the nursing notes.  Pertinent labs & imaging results that were available during my care of the patient were reviewed by me and considered in my medical decision making (see chart for details).       Patient is a 49 y.o. female with history of HTN who presents for headache that started today and vaginal itching for the past few days.    Differential diagnosis includes but is not limited to migraine headache, tension headache, cluster headache, CVA, depression, intracranial hemorrhage, meningitis/encephalitis, giant cell arteritis, idiopathic intracranial hypertension, dehydration and analgesia abuse. There are no red flag symptoms associated with headache.  On chart review, there is no head imaging available for review.  She is mildly hypertensive here.  Afebrile and satting well on room air.  Overall, patient is non-toxic-appearing, well-hydrated and in no acute distress.  Cardiopulmonary and neurological exams are normal.  Patient likely with tension headache.  Trial Flexeril  with 800 mg ibuprofen .  Advised to follow up with primary care provider or neurologist if headache persist.  Regarding her vaginal irritation.  Vaginal swab for bacterial vaginosis, yeast vaginitis, gonorrhea, chlamydia and trichomonas obtained.  HIV and syphilis recommended she is agreeable.  Discussed MDM, treatment plan and plan for follow-up with patient who agrees with plan.      Final Clinical Impressions(s) / UC Diagnoses   Final diagnoses:  Cervicogenic headache  Elevated blood pressure reading  Stress  reaction  Dizziness     Discharge Instructions      Stop by the pharmacy to pick up your prescriptions.  Follow up with your primary care provider or return to the urgent care, if not improving.   Be sure to drink plenty of fluids.  Eat 3 meals and 2 snacks daily.       ED Prescriptions     Medication Sig Dispense Auth. Provider   ibuprofen  (ADVIL ) 800 MG tablet Take 1 tablet (800 mg total) by mouth every 8 (eight) hours as needed. 30 tablet Caitlinn Klinker, DO   cyclobenzaprine  (FLEXERIL ) 5 MG tablet Take 1 tablet (5 mg total) by mouth at bedtime as needed. 30 tablet Briggette Najarian, DO      PDMP not reviewed this encounter.      [1]  Social History Tobacco Use   Smoking status: Never   Smokeless tobacco: Never  Vaping Use   Vaping status: Never Used  Substance Use Topics   Alcohol use: Yes    Comment: occasionally   Drug use: Not Currently    Types: Marijuana    Comment: last use a month ago     Karlissa Aron, DO 07/25/24 1855  "

## 2024-07-21 NOTE — Discharge Instructions (Signed)
 Stop by the pharmacy to pick up your prescriptions.  Follow up with your primary care provider or return to the urgent care, if not improving.   Be sure to drink plenty of fluids.  Eat 3 meals and 2 snacks daily.

## 2024-07-22 LAB — CERVICOVAGINAL ANCILLARY ONLY
Chlamydia: NEGATIVE
Comment: NEGATIVE
Comment: NEGATIVE
Comment: NORMAL
Neisseria Gonorrhea: NEGATIVE
Trichomonas: NEGATIVE

## 2024-07-22 LAB — SYPHILIS: RPR W/REFLEX TO RPR TITER AND TREPONEMAL ANTIBODIES, TRADITIONAL SCREENING AND DIAGNOSIS ALGORITHM: RPR Ser Ql: NONREACTIVE
